# Patient Record
Sex: Female | Born: 1960 | Race: Black or African American | Hispanic: No | Marital: Married | Smoking: Never smoker
Health system: Southern US, Community
[De-identification: ages and names within clinical notes are randomized; demographics above are authoritative.]

## PROBLEM LIST (undated history)

## (undated) DIAGNOSIS — Z803 Family history of malignant neoplasm of breast: Secondary | ICD-10-CM

## (undated) DIAGNOSIS — Z800 Family history of malignant neoplasm of digestive organs: Secondary | ICD-10-CM

## (undated) DIAGNOSIS — N8502 Endometrial intraepithelial neoplasia [EIN]: Secondary | ICD-10-CM

## (undated) DIAGNOSIS — Z1509 Genetic susceptibility to other malignant neoplasm: Secondary | ICD-10-CM

## (undated) DIAGNOSIS — IMO0002 Reserved for concepts with insufficient information to code with codable children: Secondary | ICD-10-CM

## (undated) DIAGNOSIS — Z1379 Encounter for other screening for genetic and chromosomal anomalies: Secondary | ICD-10-CM

## (undated) HISTORY — PX: BUNIONECTOMY: SHX129

## (undated) HISTORY — DX: Encounter for other screening for genetic and chromosomal anomalies: Z13.79

## (undated) HISTORY — DX: Genetic susceptibility to other malignant neoplasm: Z15.09

## (undated) HISTORY — DX: Reserved for concepts with insufficient information to code with codable children: IMO0002

## (undated) HISTORY — DX: Family history of malignant neoplasm of digestive organs: Z80.0

## (undated) HISTORY — DX: Endometrial intraepithelial neoplasia (EIN): N85.02

## (undated) HISTORY — DX: Family history of malignant neoplasm of breast: Z80.3

---

## 2010-10-11 HISTORY — PX: HYSTEROSCOPY WITH D & C: SHX1775

## 2010-12-04 ENCOUNTER — Ambulatory Visit: Payer: 59 | Attending: Gynecology | Admitting: Gynecology

## 2010-12-04 DIAGNOSIS — N8502 Endometrial intraepithelial neoplasia [EIN]: Secondary | ICD-10-CM | POA: Insufficient documentation

## 2010-12-04 DIAGNOSIS — I10 Essential (primary) hypertension: Secondary | ICD-10-CM | POA: Insufficient documentation

## 2010-12-04 DIAGNOSIS — Z79899 Other long term (current) drug therapy: Secondary | ICD-10-CM | POA: Insufficient documentation

## 2010-12-08 NOTE — Consult Note (Signed)
Bailey Gomez, Bailey Gomez NO.:  [id number]  MEDICAL RECORD NO.:  [id number]           PATIENT TYPE:  O  LOCATION:  GYN                          FACILITY:  [facility]  PHYSICIAN:  De Blanch, M.D.DATE OF BIRTH:  19-Dec-1960  DATE OF CONSULTATION:  12/04/2010 DATE OF DISCHARGE:                                CONSULTATION   REFERRING PHYSICIAN:  Dr. Hassell Done  CHIEF COMPLAINT:  Endometrial hyperplasia with atypia.  HISTORY OF PRESENT ILLNESS:  A 50 year old [place] American female seen in consultation at the request of Dr. Hassell Done regarding new diagnosis of complex hyperplasia with atypia.  The patient presented with menometrorrhagia and underwent an endometrial biopsy followed by a hysteroscopy and D and C performed on November 04, 2010.  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  The patient is having minimal amount of spotting at the present time.  The patient denies any hormone use or any other risk factors for hyperplasia with atypia.  PAST MEDICAL HISTORY/MEDICAL ILLNESSES:  Hypertension.  PAST SURGICAL HISTORY:  Bunionectomy and cesarean section.  CURRENT MEDICATIONS: 1. Multivitamins. 2. Fish oil. 3. Amlodipine 5 mg daily.  DRUG ALLERGIES:  None.  FAMILY HISTORY:  Negative for gynecologic, breast, or colon cancers.  SOCIAL HISTORY:  The patient is married.  She works full-time at [employer] in [place].  She does not smoke.  OBSTETRICAL HISTORY:  Gravida 2.  REVIEW OF SYSTEMS:  A 10-point comprehensive review of systems negative except as noted above.  PHYSICAL EXAMINATION:  VITAL SIGNS:  Weight 124 pounds, blood pressure 138/80, pulse 64. GENERAL:  Patient is a healthy female, in no acute distress. HEENT:  Negative. NECK:  Supple without thyromegaly.  There is no supraclavicular or inguinal adenopathy. ABDOMEN:  Soft, nontender.  No mass,  organomegaly, ascites, or hernias noted.  Pfannenstiel incision is well healed. PELVIC:  EGBUS, vagina, bladder, urethra are normal.  Cervix is parous with good support and minimal descent.  Uterus is anterior normal shape, size, consistency.  There are no adnexal masses noted.  Rectovaginal exam confirms. LOWER EXTREMITIES:  Without edema or varicosities.  IMPRESSION:  Complex endometrial hyperplasia with atypia.  I had a lengthy discussion with the patient and her husband regarding the natural history of this disease and therefore recommend that she undergo a hysterectomy for definitive treatment.  We had a lengthy discussion regarding the pros and cons of removing the ovaries versus preserving the ovaries and at this juncture she wished to preserve the ovaries if they appear normal.  The patient understands that we will obtain intraoperative frozen section and if a invasive malignancy is identified, we will remove the ovaries and potentially could perform lymphadenectomy.  Of the routes of surgery, we have decided to proceed with total abdominal hysterectomy through the previous Pfannenstiel incision.  The patient wished to go ahead with surgery on June 12th and she understands that will be performed by Dr. Laurette Schimke.  The risks of surgery including hemorrhage, infection, injury to adjacent viscera, thrombolic complications, anesthetic risks were all outlined and all questions were  answered.     De Blanch, M.D.  CC Hassell Done, MD    Telford Nab, RN     DC/MEDQ  D:  12/04/2010  T:  12/05/2010  Job:  [id number]  Electronically Signed by De Blanch M.D. on 12/08/2010 08:34:19 AM

## 2010-12-18 ENCOUNTER — Encounter (HOSPITAL_COMMUNITY): Payer: 59

## 2010-12-18 ENCOUNTER — Other Ambulatory Visit: Payer: Self-pay | Admitting: Gynecologic Oncology

## 2010-12-18 ENCOUNTER — Ambulatory Visit (HOSPITAL_COMMUNITY)
Admission: RE | Admit: 2010-12-18 | Discharge: 2010-12-18 | Disposition: A | Discharge status: Home / Self Care | Payer: 59 | Source: Ambulatory Visit | Attending: Obstetrics & Gynecology | Admitting: Obstetrics & Gynecology

## 2010-12-18 ENCOUNTER — Other Ambulatory Visit: Payer: Self-pay | Admitting: Obstetrics & Gynecology

## 2010-12-18 DIAGNOSIS — Z01810 Encounter for preprocedural cardiovascular examination: Secondary | ICD-10-CM | POA: Insufficient documentation

## 2010-12-18 DIAGNOSIS — I10 Essential (primary) hypertension: Secondary | ICD-10-CM | POA: Insufficient documentation

## 2010-12-18 DIAGNOSIS — Z01812 Encounter for preprocedural laboratory examination: Secondary | ICD-10-CM | POA: Insufficient documentation

## 2010-12-18 DIAGNOSIS — Z01818 Encounter for other preprocedural examination: Secondary | ICD-10-CM | POA: Insufficient documentation

## 2010-12-18 LAB — SURGICAL PCR SCREEN
MRSA, PCR: NEGATIVE
Staphylococcus aureus: NEGATIVE

## 2010-12-22 ENCOUNTER — Other Ambulatory Visit: Payer: Self-pay | Admitting: Gynecologic Oncology

## 2010-12-22 ENCOUNTER — Inpatient Hospital Stay (HOSPITAL_COMMUNITY)
Admission: RE | Admit: 2010-12-22 | Discharge: 2010-12-25 | DRG: 740 | Disposition: A | Discharge status: Home / Self Care | Payer: 59 | Source: Ambulatory Visit | Attending: Obstetrics & Gynecology | Admitting: Obstetrics & Gynecology

## 2010-12-22 DIAGNOSIS — C549 Malignant neoplasm of corpus uteri, unspecified: Principal | ICD-10-CM | POA: Diagnosis present

## 2010-12-22 DIAGNOSIS — R339 Retention of urine, unspecified: Secondary | ICD-10-CM | POA: Diagnosis not present

## 2010-12-22 DIAGNOSIS — I10 Essential (primary) hypertension: Secondary | ICD-10-CM | POA: Diagnosis present

## 2010-12-22 DIAGNOSIS — D259 Leiomyoma of uterus, unspecified: Secondary | ICD-10-CM | POA: Diagnosis present

## 2010-12-22 DIAGNOSIS — N390 Urinary tract infection, site not specified: Secondary | ICD-10-CM | POA: Diagnosis not present

## 2010-12-22 LAB — SAMPLE TO BLOOD BANK

## 2010-12-23 HISTORY — PX: ABDOMINAL HYSTERECTOMY: SHX81

## 2010-12-23 LAB — CBC
MCHC: 32.4 g/dL (ref 30.0–36.0)
Platelets: 237 10*3/uL (ref 150–400)
RDW: 12.8 % (ref 11.5–15.5)
WBC: 12.7 10*3/uL — ABNORMAL HIGH (ref 4.0–10.5)

## 2010-12-23 LAB — BASIC METABOLIC PANEL
Chloride: 100 mEq/L (ref 96–112)
GFR calc Af Amer: >60 mL/min (ref >60)
GFR calc non Af Amer: >60 mL/min (ref >60)
Potassium: 4.2 mEq/L (ref 3.5–5.1)
Sodium: 133 mEq/L — ABNORMAL LOW (ref 135–145)

## 2010-12-23 LAB — URINALYSIS, ROUTINE W REFLEX MICROSCOPIC
Glucose, UA: NEGATIVE mg/dL
Specific Gravity, Urine: 1.007 (ref 1.005–1.030)
Urobilinogen, UA: 0.2 mg/dL (ref 0.0–1.0)

## 2010-12-23 LAB — URINE MICROSCOPIC-ADD ON

## 2010-12-23 NOTE — Op Note (Addendum)
  NAMETAELA, Bailey Gomez NO.:  [id number]  MEDICAL RECORD NO.:  [id number]  LOCATION:                               FACILITY:  [facility]  PHYSICIAN:  Laurette Schimke, MD     DATE OF BIRTH:  1960-10-24  DATE OF PROCEDURE: DATE OF DISCHARGE:  12/18/2010                              OPERATIVE REPORT   PREOPERATIVE DIAGNOSIS:  Complex atypical hyperplasia.  POSTOPERATIVE DIAGNOSIS:  Complex atypical hyperplasia.  PROCEDURE:  Total abdominal hysterectomy.  SURGEON:  Laurette Schimke, MD  ASSISTANT:  Roseanna Rainbow, MD and Telford Nab, RN  ANESTHESIA:  General endotracheal.  FINDINGS:  Enlarged uterus.  SPECIMENS:  Uterus and cervix.  DESCRIPTION OF PROCEDURE:  The patient was taken to operating room, placed under general endotracheal anesthesia without any difficulty. She was prepped and draped in the usual sterile fashion.  A Pfannenstiel skin incision was made and peritoneal cavity entered.  The upper abdomen was explored and was unremarkable.  Ovaries were notable for a right simple paraovarian cyst and the left paratubal cyst.  A Bookwalter retractor was inserted.  The right round ligament was transected. Retroperitoneal space entered.  The ureter was identified and the broad ligament entered.  The  utero-ovarian ligament was clamped and transected.  The bladder flap was dissected off the lower uterine segment.  In a similar manner, the left round ligament was transected and retroperitoneal space entered.  The ureter was identified, the broad ligament entered and the utero-ovarian ligament clamped, transected, and ligated.  The uterine vessels were skeletonized and clamped, transected, and ligated.  The cervical and paracervical tissues were clamped, transected, and sutured to just below the cervix.  Clamps were placed just inferior to the cervix and the specimen removed and sent to pathology for evaluation.  The vagina was closed with an 0 PDS  suture in the standard [facility] fashion.  The utero-ovarian ligament was sutured to the round ligament to keep the ovary and tube off the vaginal cuff.  The abdomen and pelvis were irrigated and instruments removed.  Frozen section returned without any evidence of malignancy.  The laps were removed from the pelvis.  The rectus muscles were approximated to each other with interrupted 0 Vicryl sutures.  The fascia was closed with 0 PDS suture. Subcutaneous tissues were irrigated and drained.  The skin closed with a running subcuticular suture.  Sponges and needle count correct x3. Foley draining clear urine.  Estimated blood loss 50 cc.  DISPOSITION:  The patient was taken to recovery room in stable condition.     Laurette Schimke, MD     WB/MEDQ  D:  12/22/2010  T:  12/23/2010  Job:  [id number]  cc:   Roseanna Rainbow, M.D. Fax: [phone]  Telford Nab, R.N. [address] [place], [place] [address]  Hassell Done, MD Fax: [phone]  Electronically Signed by Laurette Schimke MD on 01/08/2011 07:50:54 AM

## 2010-12-25 LAB — URINE CULTURE: Colony Count: >=100000

## 2010-12-25 NOTE — Discharge Summary (Signed)
  NAMEKRISTE, BROMAN NO.:  [id number]  MEDICAL RECORD NO.:  [id number]  LOCATION:  1527                         FACILITY:  [facility]  PHYSICIAN:  Roseanna Rainbow, M.D.DATE OF BIRTH:  November 09, 1960  DATE OF ADMISSION:  12/22/2010 DATE OF DISCHARGE:  12/25/2010                              DISCHARGE SUMMARY   CHIEF COMPLAINT:  The patient is a 50 year old with a diagnosis of complex endometrial hyperplasia with atypia who presents for operative management.  Please see the dictated history and physical per Dr. Emmaline Kluver for details.  HOSPITAL COURSE:  The patient was admitted and underwent a total abdominal hysterectomy.  Please see the dictated operative summary for findings.  On postoperative day #1, her hemoglobin was 12.2.  The patient had some nausea and vomiting that resolved.  On postoperative day #2, there was an episode of urinary retention.  The Foley catheter was replaced for 24 hours.  Voiding trial was repeated and the patient was able to void without difficulty and residual was under 150 mL.  A urine culture and sensitivity grew out enterococcus greater than 100,000 colonies per mL.  She was discharged to home on postoperative day #3 tolerating a regular diet.  DISCHARGE DIAGNOSES:  endometrioid adenocarcinoma, leiomyomata stage 5 or stage IA, urinary tract infection.  PROCEDURES:  Total abdominal hysterectomy, bilateral salpingo- oophorectomy.  CONDITION:  Good.  DIET:  Regular.  ACTIVITY:  Pelvic rest, progressive activity.  MEDICATIONS:  Amlodipine, multivitamin, fish oil, Percocet 5/325 one to two tabs every 6 hours as needed, ampicillin 500 mg tab 1 tab p.o. q.i.d. #28.  ACTIVITY:  Pelvic rest, progressive activity.  DISPOSITION:  The patient will follow up in the GYN oncology office.     Roseanna Rainbow, M.D.     LAJ/MEDQ  D:  12/25/2010  T:  12/25/2010  Job:  [id number]  cc:   Telford Nab, R.N. [address] [place], [place] [address]  Hassell Done, MD Fax: [phone]  Electronically Signed by Antionette Char M.D. on 12/25/2010 05:00:40 PM

## 2011-01-27 ENCOUNTER — Ambulatory Visit: Payer: 59 | Attending: Gynecologic Oncology | Admitting: Gynecologic Oncology

## 2011-01-27 DIAGNOSIS — N83209 Unspecified ovarian cyst, unspecified side: Secondary | ICD-10-CM | POA: Insufficient documentation

## 2011-01-27 DIAGNOSIS — C55 Malignant neoplasm of uterus, part unspecified: Secondary | ICD-10-CM | POA: Insufficient documentation

## 2011-01-29 NOTE — Consult Note (Signed)
  NAMEJESSABELLE, Gomez NO.:  [id number]  MEDICAL RECORD NO.:  [id number]  LOCATION:  GYN                          FACILITY:  [facility]  PHYSICIAN:  Anay Walter A. Duard Brady, MD    DATE OF BIRTH:  10-09-1960  DATE OF CONSULTATION:  01/27/2011 DATE OF DISCHARGE:                                CONSULTATION   HISTORY:  Ms. Chestine Spore is a very pleasant 50 year old who was seen by Dr. Leota Jacobsen with a biopsy of complex hyperplasia with atypia.  She presented to him with menometrorrhagia and underwent endometrial biopsy followed by hysteroscopy, D and C in April.  She did well from that procedure.  She was seen by [place] and surgery was recommended.  She subsequent underwent a TAH by Dr. Nelly Rout on December 23, 2010.  The patient wished to keep her ovaries at the time of surgery if they were unremarkable.  Operative findings included a simple paraovarian cyst on the right and a left paratubal cyst that were similarly removed. Surgery was uncomplicated.  Pathology revealed multiple microscopic foci of well-differentiated endometrioid adenocarcinoma arising in a large endometrial polyp associated with complex hyperplasia, confined within the endometrium.  There was no lymphovascular space involvement.  The cervix was negative.  There were fibroids.  She had a right paraovarian cyst that was benign and a left tubal cyst that was negative.  She comes in today for her postoperative check.  She is overall doing quite well. She feels that she has really recovered quite well.  She is looking forward to going back to work next week.  She did have problems with initial constipation postoperatively and that subsequently resolved.  PHYSICAL EXAMINATION:  VITAL SIGNS:  Weight 124 pounds, blood pressure 120/80, pulse 64, respirations 16, temperature 98. GENERAL:  A well-nourished, well-developed female, in no acute distress. ABDOMEN:  A well-healed Pfannenstiel skin incision.  There is one small area of  granulation tissue measuring approximately 4 mm.  Silver nitrate was applied. ABDOMEN:  Soft and nontender. PELVIC:  External genitalia is within normal limits.  The vaginal cuff is visualized.  It is healing well.  Bimanual examination reveals suture line to be intact.  There is no nodularity.  There is no fluctuance or tenderness.  ASSESSMENT:  A 50 year old with a stage IA, grade 1 endometrioid adenocarcinoma, doing well postoperatively.  PLAN: 1. She will follow up with [place] in 6 months and will begin alternating     visits with Dr. Leota Jacobsen every 6 months for     1 year's time. 2. She would like to go back to work next week and she has paperwork     for [place] to complete. 3. I have asked her refrain from intercourse for 2 additional weeks.     Jazmin Vensel A. Duard Brady, MD     PAG/MEDQ  D:  01/27/2011  T:  01/27/2011  Job:  [id number]  cc:   Telford Nab, R.N. [address] [place], [place] [address]  Hassell Done, MD Fax: [phone]  Electronically Signed by Cleda Mccreedy MD on 01/29/2011 10:27:04 AM

## 2012-02-15 ENCOUNTER — Encounter: Payer: Self-pay | Admitting: Gynecologic Oncology

## 2012-02-17 ENCOUNTER — Encounter: Payer: Self-pay | Admitting: Gynecologic Oncology

## 2012-02-17 ENCOUNTER — Ambulatory Visit: Payer: 59 | Attending: Gynecologic Oncology | Admitting: Gynecologic Oncology

## 2012-02-17 VITALS — BP 110/80 | HR 64 | Temp 97.6°F | Resp 16 | Ht 62.64 in | Wt 129.4 lb

## 2012-02-17 DIAGNOSIS — C541 Malignant neoplasm of endometrium: Secondary | ICD-10-CM

## 2012-02-17 DIAGNOSIS — Z90710 Acquired absence of both cervix and uterus: Secondary | ICD-10-CM | POA: Insufficient documentation

## 2012-02-17 DIAGNOSIS — C549 Malignant neoplasm of corpus uteri, unspecified: Secondary | ICD-10-CM | POA: Insufficient documentation

## 2012-02-17 MED ORDER — MEGESTROL ACETATE 40 MG PO TABS: 40.0000 mg | ORAL_TABLET | Freq: Every day | ORAL | Status: DC

## 2012-02-17 NOTE — Progress Notes (Signed)
HISTORY: Bailey Gomez is a very pleasant 51 y.o. year-old who was seen by Dr.  Leota Jacobsen with a biopsy of complex hyperplasia with atypia. She  presented to him with menometrorrhagia and underwent endometrial biopsy followed by hysteroscopy, D and C in April. She did well from that  procedure. She was seen by [place] and surgery was recommended. She subsequent underwent a TAH by Dr. Nelly Rout on December 23, 2010. The  patient wished to keep her ovaries at the time of surgery if they were unremarkable. Operative findings included a simple paraovarian cyst on  the right and a left paratubal cyst that were similarly removed. Surgery was uncomplicated. Pathology revealed multiple microscopic foci  of well-differentiated endometrioid adenocarcinoma arising in a large endometrial polyp associated with complex hyperplasia, confined within  the endometrium. There was no lymphovascular space involvement. The cervix was negative. There were fibroids. She had a right paraovarian  cyst that was benign and a left tubal cyst that was negative.   INTERVAL HISTORY:  Patient has done well since surgery.  Saw Dr. Leota Jacobsen 6 months ago.  Last mammogram 12/2011.  Last colonoscopy at 51 years old.  Reports significant vasomotor instability, awakens several times from sleep.  C/o vaginal dryness.    ROS:  Feels good.  Reports hot flashes that awaken her from sleep 5-6 times.  No weight loss, no nausea/vomiting, no hematochezia, hematuria or vaginal bleeding.  No adenopathy.  Reports vaginal dryness.  Reports constipation.  Otherwise system review is negative.  PHYSICAL EXAMINATION: VITAL SIGNS: BP 110/80  Pulse 64  Temp 97.6 F (36.4 C) (Oral)  Resp 16  Ht 5' 2.64" (1.591 m)  Wt 129 lb 6.4 oz (58.695 kg)  BMI 23.19 kg/m2 GENERAL: A well-nourished, well-developed female, in no acute distress.  ABDOMEN: A well-healed Pfannenstiel skin incision. Incision is intact without hernia. BACK:  No CVAT LN:  No cervical supraclavicular or  inguinal adenopathy PELVIC: External genitalia is within normal limits. The vagina is atrophic, no masses in the cul de sac Rectal:  Good tone, no masses EXT:  No CCE  ASSESSMENT: A 51 y.o.-year-old with a stage IA, grade 1 endometrioid adenocarcinoma, doing well who is without evidence of disease PLAN:  1.F/U with Dr. Leota Jacobsen in 6 months  2.  F/U with Gyn Onc in 12 months 3.  Megace 40mg  for vasomotor instability.  Patient counseled on weight gain and fluid retention.

## 2012-02-17 NOTE — Patient Instructions (Addendum)
No evidence of disease PLAN:  1.F/U with Dr. Leota Jacobsen in 6 months  2.  F/U with Gyn Onc in 12 months 3.  Megace 40mg  for vasomotor instability.  Patient counseled on weight gain and fluid retention.  Thank you very much [name] for allowing me to provide care for you today.  I appreciate your confidence in choosing our Gynecologic Oncology team.  If you have any questions about your visit today please call our office and we will get back to you as soon as possible.  [name]. Cathlin Buchan MD., PhD Gynecologic Oncology

## 2012-10-30 ENCOUNTER — Other Ambulatory Visit: Payer: Self-pay | Admitting: Gynecologic Oncology

## 2013-03-06 ENCOUNTER — Ambulatory Visit: Payer: 59 | Admitting: Gynecologic Oncology

## 2013-04-12 ENCOUNTER — Encounter: Payer: Self-pay | Admitting: Gynecologic Oncology

## 2013-04-12 ENCOUNTER — Ambulatory Visit: Payer: 59 | Attending: Gynecologic Oncology | Admitting: Gynecologic Oncology

## 2013-04-12 VITALS — BP 125/83 | HR 78 | Temp 98.9°F | Resp 16

## 2013-04-12 DIAGNOSIS — N952 Postmenopausal atrophic vaginitis: Secondary | ICD-10-CM | POA: Insufficient documentation

## 2013-04-12 DIAGNOSIS — N951 Menopausal and female climacteric states: Secondary | ICD-10-CM

## 2013-04-12 DIAGNOSIS — C541 Malignant neoplasm of endometrium: Secondary | ICD-10-CM

## 2013-04-12 DIAGNOSIS — R55 Syncope and collapse: Secondary | ICD-10-CM | POA: Insufficient documentation

## 2013-04-12 DIAGNOSIS — N83209 Unspecified ovarian cyst, unspecified side: Secondary | ICD-10-CM | POA: Insufficient documentation

## 2013-04-12 DIAGNOSIS — D259 Leiomyoma of uterus, unspecified: Secondary | ICD-10-CM | POA: Insufficient documentation

## 2013-04-12 DIAGNOSIS — N838 Other noninflammatory disorders of ovary, fallopian tube and broad ligament: Secondary | ICD-10-CM | POA: Insufficient documentation

## 2013-04-12 DIAGNOSIS — N840 Polyp of corpus uteri: Secondary | ICD-10-CM | POA: Insufficient documentation

## 2013-04-12 DIAGNOSIS — C549 Malignant neoplasm of corpus uteri, unspecified: Secondary | ICD-10-CM | POA: Insufficient documentation

## 2013-04-12 MED ORDER — MEGESTROL ACETATE 40 MG PO TABS: ORAL_TABLET | ORAL | Status: DC

## 2013-04-12 NOTE — Patient Instructions (Addendum)
F/u in 1 year    Thank you very much Bailey Gomez for allowing me to provide care for you today.  I appreciate your confidence in choosing our Gynecologic Oncology team.  If you have any questions about your visit today please call our office and we will get back to you as soon as possible.  [name]. Nihar Klus MD., PhD Gynecologic Oncology

## 2013-04-12 NOTE — Progress Notes (Signed)
Office Visit:  GYN ONCOLOGY  CHIEF COMPLAINT:  Endometrial cancer surveillance  HISTORY: [name] is a very pleasant 52 y.o. year-old who was seen by Dr.  Leota Jacobsen with a biopsy of complex hyperplasia with atypia. She presented to him with menometrorrhagia and underwent endometrial biopsy followed by hysteroscopy, D and C in April. She did well from that procedure. She was seen by [place] and surgery was recommended. She subsequent underwent a TAH . The patient wished to keep her ovaries at the time of surgery if they were unremarkable. Operative findings included a simple paraovarian cyst on the right and a left paratubal cyst that were similarly removed. Surgery was uncomplicated. Pathology revealed multiple microscopic foci of well-differentiated endometrioid adenocarcinoma arising in a large endometrial polyp associated with complex hyperplasia, confined within the endometrium. There was no lymphovascular space involvement. The cervix was negative. There were fibroids. She had a right paraovarian cyst that was benign and a left tubal cyst that was negative.    Patient has done well since surgery.  Saw Dr. Leota Jacobsen 6 months ago.  Last mammogram 12/2011.  Last colonoscopy at 52 years old.  Reports significant vasomotor instability, awakens several times from sleep.  C/o vaginal dryness.    ROS:  Feels good. No cough, SOB, no chest pain,  Reports hot flashes that awaken her from sleep once nighlty on Megace   No weight loss, no nausea/vomiting, no hematochezia, hematuria or vaginal bleeding.  Reports lower pelvic discomfort with sexual intercourse.  No adenopathy.  Reports vaginal dryness.  Reports thinning of her eyebrows and eyelashes. Reports constipation.  Otherwise 10 system review is negative.  PHYSICAL EXAMINATION: VITAL SIGNS:  BP 125/83  Pulse 78  Temp(Src) 98.9 F (37.2 C) (Oral)  Resp 16 Wt Readings from Last 3 Encounters:  02/17/12 129 lb 6.4 oz (58.695 kg)  GENERAL: A well-nourished,  well-developed female, in no acute distress.  ABDOMEN: A well-healed Pfannenstiel skin incision. Incision is intact without hernia. BACK:  No CVAT LN:  No cervical supraclavicular or inguinal adenopathy PELVIC: External genitalia is within normal limits. The vagina is atrophic, no masses in the cul de sac Rectal:  Good tone, no masses EXT:  No CCE  ASSESSMENT: A 52 y.o.-year-old with a stage IA, grade 1 endometrioid adenocarcinoma, doing well who is without evidence of disease  PLAN:  1.F/U with Dr. Leota Jacobsen in 6 months  2.  F/U with Gyn Onc in 12 months 3.  Megace 40mg   QOD for vasomotor instability.  Patient counseled on weight gain and fluid retention.  F/U with dermatology regarding eyelashes and eyebrows

## 2015-12-03 ENCOUNTER — Ambulatory Visit (INDEPENDENT_AMBULATORY_CARE_PROVIDER_SITE_OTHER): Payer: Commercial Managed Care - HMO

## 2015-12-03 ENCOUNTER — Ambulatory Visit (INDEPENDENT_AMBULATORY_CARE_PROVIDER_SITE_OTHER): Payer: Commercial Managed Care - HMO | Admitting: Sports Medicine

## 2015-12-03 ENCOUNTER — Encounter: Payer: Self-pay | Admitting: Sports Medicine

## 2015-12-03 DIAGNOSIS — M898X9 Other specified disorders of bone, unspecified site: Secondary | ICD-10-CM | POA: Diagnosis not present

## 2015-12-03 DIAGNOSIS — M778 Other enthesopathies, not elsewhere classified: Secondary | ICD-10-CM

## 2015-12-03 DIAGNOSIS — M7750 Other enthesopathy of unspecified foot: Secondary | ICD-10-CM | POA: Diagnosis not present

## 2015-12-03 DIAGNOSIS — M79673 Pain in unspecified foot: Secondary | ICD-10-CM

## 2015-12-03 DIAGNOSIS — M19079 Primary osteoarthritis, unspecified ankle and foot: Secondary | ICD-10-CM

## 2015-12-03 DIAGNOSIS — M779 Enthesopathy, unspecified: Secondary | ICD-10-CM

## 2015-12-03 MED ORDER — TRIAMCINOLONE ACETONIDE 10 MG/ML IJ SUSP: 10.0000 mg | Freq: Once | INTRAMUSCULAR | Status: AC

## 2015-12-03 MED ORDER — DICLOFENAC SODIUM 75 MG PO TBEC: 75.0000 mg | DELAYED_RELEASE_TABLET | Freq: Two times a day (BID) | ORAL | Status: DC

## 2015-12-03 NOTE — Progress Notes (Signed)
Patient ID: Bailey Gomez, female   DOB: Nov 20, 1960, 55 y.o.   MRN: [id number] Subjective: Bailey Gomez is a 55 y.o. female patient who presents to office for evaluation of right>left foot pain. Patient complains of progressive pain especially over the last few weeks since returning to work over the top of her R>L foot. Reports that she works 12 hours and does a lot of walking and has developed pain at the top with bumps. Patient has tried changes in shoes and compression stockings with a little relief in symptoms. Patient denies any other pedal complaints. Denies injury/trip/fall/sprain/any causative factors.   Patient Active Problem List   Diagnosis Date Noted  . Menopausal symptoms 04/12/2013  . Endometrial cancer ([place]) 02/17/2012    Current Outpatient Prescriptions on File Prior to Visit  Medication Sig Dispense Refill  . megestrol (MEGACE) 40 MG tablet TAKE 1 TABLET BY MOUTH EVERY DAY 30 tablet 12  . simvastatin (ZOCOR) 5 MG tablet Take 5 mg by mouth at bedtime.     No current facility-administered medications on file prior to visit.    No Known Allergies  Objective:  General: Alert and oriented x3 in no acute distress  Dermatology:Old surgical scars well healed, No open lesions bilateral lower extremities, no webspace macerations, no ecchymosis bilateral, all nails x 10 are well manicured.  Vascular: Dorsalis Pedis and Posterior Tibial pedal pulses palpable, Capillary Fill Time 3 seconds,(+) pedal hair growth bilateral, no edema bilateral lower extremities, Temperature gradient within normal limits.  Neurology: Johney Maine sensation intact via light touch bilateral. (- )Tinels sign bilateral.   Musculoskeletal: Mild tenderness with palpation at bony prominence at dorsal right>left midfoot,No pain with calf compression bilateral. Midtarsal and ankle joint range of motion limited bilateral, Subtalar joint range of motion is within normal limits, there is no 1st ray hypermobility noted  bilateral, mild decreased 1st MPJ rom Right>Left with functional limitus noted on weightbearing exam. Strength within normal limits in all groups bilateral.   Xrays  Right and Left Foot   Impression: Hardware intact at previous bunion and bunionette surgical sites, calcaneal spur and midfoot and ankle arthritis with spurs present. Soft tissues within normal limits.   Assessment and Plan: Problem List Items Addressed This Visit    None    Visit Diagnoses    Foot pain, unspecified laterality    -  Primary    Relevant Medications    triamcinolone acetonide (KENALOG) 10 MG/ML injection 10 mg    diclofenac (VOLTAREN) 75 MG EC tablet    Other Relevant Orders    DG Foot 2 Views Left    DG Foot 2 Views Right    Capsulitis of foot, unspecified laterality        Relevant Medications    triamcinolone acetonide (KENALOG) 10 MG/ML injection 10 mg    diclofenac (VOLTAREN) 75 MG EC tablet    Bony exostosis        Relevant Medications    triamcinolone acetonide (KENALOG) 10 MG/ML injection 10 mg    diclofenac (VOLTAREN) 75 MG EC tablet    Osteoarthritis of ankle and foot, unspecified laterality        Relevant Medications    triamcinolone acetonide (KENALOG) 10 MG/ML injection 10 mg    diclofenac (VOLTAREN) 75 MG EC tablet        -Complete examination performed -Xrays reviewed -Discussed treatement options for capsulitis with exostosis secondary to arthritis  -Rx Diclofenac to take as instructed until completed -After oral consent and aseptic prep, injected a  mixture containing 1 ml of 2%  plain lidocaine, 1 ml 0.5% plain marcaine, 0.5 ml of kenalog 10 and 0.5 ml of dexamethasone phosphate into right and left dorsal midfoot at site of exostosis without complication. Post-injection care discussed with patient.  -Applied offloading pad to site to prevent dorsal irritation, advised patient to do the same with she is wearing enclosed shoes -Recommend ice and elevation as needed -Recommend  compression stocking for periods of long standing and walking -Recommend good supportive shoes and alternative lacing -Patient to return to office as needed or sooner if condition worsens.  [name], DPM

## 2016-04-29 HISTORY — PX: TOTAL HIP ARTHROPLASTY: SHX124

## 2016-06-10 ENCOUNTER — Ambulatory Visit (INDEPENDENT_AMBULATORY_CARE_PROVIDER_SITE_OTHER): Payer: Commercial Managed Care - HMO | Admitting: Sports Medicine

## 2016-06-10 ENCOUNTER — Encounter: Payer: Self-pay | Admitting: Sports Medicine

## 2016-06-10 DIAGNOSIS — M7750 Other enthesopathy of unspecified foot: Secondary | ICD-10-CM

## 2016-06-10 DIAGNOSIS — M779 Enthesopathy, unspecified: Principal | ICD-10-CM

## 2016-06-10 DIAGNOSIS — M778 Other enthesopathies, not elsewhere classified: Secondary | ICD-10-CM

## 2016-06-10 DIAGNOSIS — M19079 Primary osteoarthritis, unspecified ankle and foot: Secondary | ICD-10-CM

## 2016-06-10 DIAGNOSIS — M898X9 Other specified disorders of bone, unspecified site: Secondary | ICD-10-CM

## 2016-06-10 MED ORDER — TRIAMCINOLONE ACETONIDE 40 MG/ML IJ SUSP: 20.0000 mg | Freq: Once | INTRAMUSCULAR | Status: AC

## 2016-06-10 NOTE — Progress Notes (Signed)
Patient ID: Bailey Gomez, female   DOB: 19-May-1961, 55 y.o.   MRN: [id number]   Subjective: Bailey Gomez is a 55 y.o. female patient who returns to office for evaluation of right>left foot pain. Patient states that since last week started to have increased pain over the bony prominence of her right foot. States that the injection that she received last visit helped tremendously and she desires another states that since her left total hip replacement surgery. She has been overcompensating and over favoring the right foot. 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 Patient denies any other acute changes or problems.   Patient Active Problem List   Diagnosis Date Noted  . Menopausal symptoms 04/12/2013  . Endometrial cancer ([place]) 02/17/2012    Current Outpatient Prescriptions on File Prior to Visit  Medication Sig Dispense Refill  . amLODipine (NORVASC) 10 MG tablet Take 10 mg by mouth daily.  3  . Dermatological Products, Misc. (NUVAIL) SOLN APPLY AT BEDTIME TO NAIL  1  . diclofenac (VOLTAREN) 75 MG EC tablet Take 1 tablet (75 mg total) by mouth 2 (two) times daily. 30 tablet 0  . megestrol (MEGACE) 40 MG tablet TAKE 1 TABLET BY MOUTH EVERY DAY 30 tablet 12  . simvastatin (ZOCOR) 5 MG tablet Take 5 mg by mouth at bedtime.     Current Facility-Administered Medications on File Prior to Visit  Medication Dose Route Frequency Provider Last Rate Last Dose  . triamcinolone acetonide (KENALOG) 10 MG/ML injection 10 mg  10 mg Other Once [employer], DPM        No Known Allergies  Objective:  General: Alert and oriented x3 in no acute distress  Dermatology:Old surgical scars well healed, No open lesions bilateral lower extremities, no webspace macerations, no ecchymosis bilateral, all nails x 10 are well manicured.  Vascular: Dorsalis Pedis and Posterior Tibial pedal pulses palpable, Capillary Fill Time 3 seconds, (+) pedal hair growth  bilateral, no edema bilateral lower extremities, Temperature gradient within normal limits.  Neurology: Gross sensation intact via light touch on right. 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 (- )Tinels sign bilateral.   Musculoskeletal: Mild tenderness with palpation at bony prominence at dorsal right>left midfoot,No pain with calf compression bilateral. Midtarsal and ankle joint range of motion limited bilateral, Subtalar joint range of motion is within normal limits, there is no 1st ray hypermobility noted bilateral, mild decreased 1st MPJ rom Right>Left with functional limitus noted on weightbearing exam. Strength within normal limits in all groups bilateral.   Assessment and Plan: Problem List Items Addressed This Visit    None    Visit Diagnoses    Capsulitis of foot, unspecified laterality    -  Primary   R>L   Relevant Medications   triamcinolone acetonide (KENALOG-40) injection 20 mg (Start on 06/10/2016  5:30 PM)   Bony exostosis       Relevant Medications   triamcinolone acetonide (KENALOG-40) injection 20 mg (Start on 06/10/2016  5:30 PM)   Osteoarthritis of ankle and foot, unspecified laterality       Relevant Medications   aspirin 81 MG EC tablet   celecoxib (CELEBREX) 200 MG capsule   oxyCODONE (OXY IR/ROXICODONE) 5 MG immediate release tablet   triamcinolone acetonide (KENALOG-40) injection 20 mg (Start on 06/10/2016  5:30 PM)     -Complete examination performed -Previous Xrays reviewed -Discussed treatement options for capsulitis with exostosis secondary to arthritis  -  After oral consent and aseptic prep, injected a mixture containing 1 ml of 2% plain lidocaine, 1 ml 0.5% plain marcaine, 0.5 ml of kenalog 40 and 0.5 ml of dexamethasone phosphate into right dorsal midfoot at site of exostosis without complication. Post-injection care discussed with patient.  -Recommend Epsom soaks, ice, and elevation as needed -Recommend continue with  compression stocking for periods of long standing and walking -Recommend continue with good supportive shoes and alternative lacing. Advised to avoid shoes that will cause irritation to dorsal aspect of foot.  -Patient to return to office as needed or sooner if condition worsens.  [name], DPM

## 2016-08-09 DIAGNOSIS — R208 Other disturbances of skin sensation: Secondary | ICD-10-CM | POA: Diagnosis not present

## 2016-08-12 DIAGNOSIS — R208 Other disturbances of skin sensation: Secondary | ICD-10-CM | POA: Diagnosis not present

## 2016-08-13 DIAGNOSIS — G5702 Lesion of sciatic nerve, left lower limb: Secondary | ICD-10-CM | POA: Diagnosis not present

## 2016-08-17 DIAGNOSIS — G5702 Lesion of sciatic nerve, left lower limb: Secondary | ICD-10-CM | POA: Diagnosis not present

## 2016-08-19 DIAGNOSIS — G5702 Lesion of sciatic nerve, left lower limb: Secondary | ICD-10-CM | POA: Diagnosis not present

## 2016-08-19 DIAGNOSIS — R200 Anesthesia of skin: Secondary | ICD-10-CM | POA: Diagnosis not present

## 2016-08-31 DIAGNOSIS — M1612 Unilateral primary osteoarthritis, left hip: Secondary | ICD-10-CM | POA: Diagnosis not present

## 2016-08-31 DIAGNOSIS — Z96642 Presence of left artificial hip joint: Secondary | ICD-10-CM | POA: Diagnosis not present

## 2016-09-02 ENCOUNTER — Ambulatory Visit (INDEPENDENT_AMBULATORY_CARE_PROVIDER_SITE_OTHER): Payer: Commercial Managed Care - HMO | Admitting: Sports Medicine

## 2016-09-02 ENCOUNTER — Telehealth: Payer: Self-pay | Admitting: *Deleted

## 2016-09-02 ENCOUNTER — Ambulatory Visit (INDEPENDENT_AMBULATORY_CARE_PROVIDER_SITE_OTHER): Payer: Commercial Managed Care - HMO

## 2016-09-02 ENCOUNTER — Encounter: Payer: Self-pay | Admitting: Sports Medicine

## 2016-09-02 DIAGNOSIS — M779 Enthesopathy, unspecified: Secondary | ICD-10-CM

## 2016-09-02 DIAGNOSIS — M778 Other enthesopathies, not elsewhere classified: Secondary | ICD-10-CM

## 2016-09-02 DIAGNOSIS — R29898 Other symptoms and signs involving the musculoskeletal system: Secondary | ICD-10-CM

## 2016-09-02 DIAGNOSIS — M76821 Posterior tibial tendinitis, right leg: Secondary | ICD-10-CM | POA: Diagnosis not present

## 2016-09-02 DIAGNOSIS — M7750 Other enthesopathy of unspecified foot: Secondary | ICD-10-CM

## 2016-09-02 DIAGNOSIS — M79672 Pain in left foot: Secondary | ICD-10-CM | POA: Diagnosis not present

## 2016-09-02 DIAGNOSIS — M792 Neuralgia and neuritis, unspecified: Secondary | ICD-10-CM

## 2016-09-02 DIAGNOSIS — M898X9 Other specified disorders of bone, unspecified site: Secondary | ICD-10-CM

## 2016-09-02 DIAGNOSIS — M79671 Pain in right foot: Secondary | ICD-10-CM

## 2016-09-02 DIAGNOSIS — R531 Weakness: Secondary | ICD-10-CM

## 2016-09-02 DIAGNOSIS — M19079 Primary osteoarthritis, unspecified ankle and foot: Secondary | ICD-10-CM

## 2016-09-02 MED ORDER — TRIAMCINOLONE ACETONIDE 40 MG/ML IJ SUSP: 20.0000 mg | Freq: Once | INTRAMUSCULAR | Status: AC

## 2016-09-02 NOTE — Progress Notes (Signed)
Patient ID: Bailey Gomez, female   DOB: 09-24-1960, 56 y.o.   MRN: [id number]   Subjective: Bailey Gomez is a 56 y.o. female patient who returns to office for evaluation of right>left foot pain. Patient states that she is having pain on inside of right foot that hurts worse than the pain at top of her feet where there is arthritis. Patient requests injection on right and states that the weakness, pain, and change to left foot since after her hip surgery with nerve damage is getting worse. Reports that she got inserts from [facility] with pressure mapping and also got NCV/EMG studies done that showed damage with muscle weakness. Patient denies any other acute changes or problems.   Patient Active Problem List   Diagnosis Date Noted  . Menopausal symptoms 04/12/2013  . Endometrial cancer ([place]) 02/17/2012    Current Outpatient Prescriptions on File Prior to Visit  Medication Sig Dispense Refill  . amLODipine (NORVASC) 10 MG tablet Take 10 mg by mouth daily.  3  . aspirin 81 MG EC tablet     . celecoxib (CELEBREX) 200 MG capsule     . Dermatological Products, Misc. (NUVAIL) SOLN APPLY AT BEDTIME TO NAIL  1  . diclofenac (VOLTAREN) 75 MG EC tablet Take 1 tablet (75 mg total) by mouth 2 (two) times daily. 30 tablet 0  . docusate sodium (COLACE) 100 MG capsule     . HIBICLENS 4 % external liquid APPLY LIQUID TO AFFECTED AREA DAILY, STARTING SEVEN DAYS BEFORE SURGERY  0  . LYRICA 75 MG capsule     . megestrol (MEGACE) 40 MG tablet TAKE 1 TABLET BY MOUTH EVERY DAY 30 tablet 12  . oxyCODONE (OXY IR/ROXICODONE) 5 MG immediate release tablet     . pantoprazole (PROTONIX) 40 MG tablet     . simvastatin (ZOCOR) 5 MG tablet Take 5 mg by mouth at bedtime.     Current Facility-Administered Medications on File Prior to Visit  Medication Dose Route Frequency Provider Last Rate Last Dose  . triamcinolone acetonide (KENALOG) 10 MG/ML injection 10 mg  10 mg Other Once [employer], DPM      .  triamcinolone acetonide (KENALOG-40) injection 20 mg  20 mg Other Once [employer], DPM        No Known Allergies  Objective:  General: Alert and oriented x3 in no acute distress  Dermatology:Old surgical scars well healed, No open lesions bilateral lower extremities, no webspace macerations, no ecchymosis bilateral, all nails x 10 are well manicured.  Vascular: Dorsalis Pedis and Posterior Tibial pedal pulses palpable, Capillary Fill Time 3 seconds, (+) pedal hair growth bilateral, no edema bilateral lower extremities, Temperature gradient within normal limits.  Neurology: Gross sensation intact via light touch on right. 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 (- )Tinels sign bilateral.   Musculoskeletal: Moderate tenderness at navicular tuberosity and along posterior tibial tendon course on right, Mild tenderness with palpation at bony prominence at dorsal right>left midfoot,No pain with calf compression bilateral. Midtarsal and ankle joint range of motion limited bilateral, Subtalar joint range of motion is within normal limits, there is no 1st ray hypermobility noted bilateral, mild decreased 1st MPJ rom Right>Left with functional limitus noted on weightbearing exam. Strength within normal limits in all groups bilateral except left.   Xray right and left foot- mild decreased osseous mineralization, midfoot dorsal bone spur with midtarsal breach supportive of arthritis. Mild inferior calcaneal spur. Surgical fixation intact.  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 Post-injection care discussed with patient.  -Rx fascial brace for right -Recommend Epsom soaks, ice, and elevation as needed -Recommend continue with compression stocking for periods of long standing and walking -Recommend continue with good supportive shoes and orthotics.  -Consult to neurology for further eval of left neuritis with weakness  -Long term patient may benefit from assistive bracing on left  -Patient to return to office as needed/after neurology consult or sooner if condition worsens.  [name], DPM

## 2016-09-02 NOTE — Telephone Encounter (Addendum)
-----   Message from [name], [place] sent at 09/02/2016  9:18 AM EST ----- Regarding: Neurology consult  Patient with left sided weakness after nerve damage from hip surgery has had NCV/EMG. Please have her evaluated by a neurologist -Dr. Cannon Kettle. Referral, clinical and Demographics faxed to [facility].

## 2016-09-07 DIAGNOSIS — G5702 Lesion of sciatic nerve, left lower limb: Secondary | ICD-10-CM | POA: Diagnosis not present

## 2016-10-07 ENCOUNTER — Telehealth: Payer: Self-pay | Admitting: *Deleted

## 2016-10-07 DIAGNOSIS — M792 Neuralgia and neuritis, unspecified: Secondary | ICD-10-CM

## 2016-10-07 NOTE — Telephone Encounter (Addendum)
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 Cannon Kettle. 10/07/2016-Faxed referral, clinicals and demographics to [facility].

## 2016-10-14 ENCOUNTER — Encounter: Payer: Self-pay | Admitting: Neurology

## 2016-10-29 ENCOUNTER — Ambulatory Visit (INDEPENDENT_AMBULATORY_CARE_PROVIDER_SITE_OTHER): Payer: Commercial Managed Care - HMO

## 2016-10-29 ENCOUNTER — Encounter: Payer: Self-pay | Admitting: Sports Medicine

## 2016-10-29 ENCOUNTER — Ambulatory Visit (INDEPENDENT_AMBULATORY_CARE_PROVIDER_SITE_OTHER): Payer: Commercial Managed Care - HMO | Admitting: Sports Medicine

## 2016-10-29 DIAGNOSIS — M778 Other enthesopathies, not elsewhere classified: Secondary | ICD-10-CM

## 2016-10-29 DIAGNOSIS — R52 Pain, unspecified: Secondary | ICD-10-CM

## 2016-10-29 DIAGNOSIS — M7750 Other enthesopathy of unspecified foot: Secondary | ICD-10-CM | POA: Diagnosis not present

## 2016-10-29 DIAGNOSIS — M779 Enthesopathy, unspecified: Principal | ICD-10-CM

## 2016-10-29 DIAGNOSIS — M19079 Primary osteoarthritis, unspecified ankle and foot: Secondary | ICD-10-CM | POA: Diagnosis not present

## 2016-10-29 DIAGNOSIS — M898X9 Other specified disorders of bone, unspecified site: Secondary | ICD-10-CM

## 2016-10-29 MED ORDER — DICLOFENAC SODIUM 75 MG PO TBEC: 75.0000 mg | DELAYED_RELEASE_TABLET | Freq: Two times a day (BID) | ORAL | 0 refills | Status: DC

## 2016-10-29 MED ORDER — TRIAMCINOLONE ACETONIDE 10 MG/ML IJ SUSP: 10.0000 mg | Freq: Once | INTRAMUSCULAR | Status: AC

## 2016-10-29 NOTE — Progress Notes (Signed)
Patient ID: Bailey Gomez, female   DOB: [birth year], 56 y.o.   MRN: [id number]   Subjective: Bailey Gomez is a 56 y.o. female patient who returns to office for evaluation of bilateral foot pain. Patient states that she is having pain on top of right foot and side of left ankle, worse since a few days ago after being on her feet with her grandkids. Patient requests injections. Patient denies any other acute changes or problems.   Patient Active Problem List   Diagnosis Date Noted  . Menopausal symptoms 04/12/2013  . Endometrial cancer ([place]) 02/17/2012    Current Outpatient Prescriptions on File Prior to Visit  Medication Sig Dispense Refill  . amLODipine (NORVASC) 10 MG tablet Take 10 mg by mouth daily.  3  . aspirin 81 MG EC tablet     . celecoxib (CELEBREX) 200 MG capsule     . Dermatological Products, Misc. (NUVAIL) SOLN APPLY AT BEDTIME TO NAIL  1  . docusate sodium (COLACE) 100 MG capsule     . HIBICLENS 4 % external liquid APPLY LIQUID TO AFFECTED AREA DAILY, STARTING SEVEN DAYS BEFORE SURGERY  0  . LYRICA 75 MG capsule     . megestrol (MEGACE) 40 MG tablet TAKE 1 TABLET BY MOUTH EVERY DAY 30 tablet 12  . oxyCODONE (OXY IR/ROXICODONE) 5 MG immediate release tablet     . pantoprazole (PROTONIX) 40 MG tablet     . simvastatin (ZOCOR) 5 MG tablet Take 5 mg by mouth at bedtime.     Current Facility-Administered Medications on File Prior to Visit  Medication Dose Route Frequency Provider Last Rate Last Dose  . triamcinolone acetonide (KENALOG) 10 MG/ML injection 10 mg  10 mg Other Once [employer], DPM      . triamcinolone acetonide (KENALOG-40) injection 20 mg  20 mg Other Once [employer], DPM      . triamcinolone acetonide (KENALOG-40) injection 20 mg  20 mg Other Once [employer], DPM        No Known Allergies  Objective:  General: Alert and oriented x3 in no acute distress  Dermatology:Old surgical scars well healed, No open lesions bilateral lower extremities, no  webspace macerations, no ecchymosis bilateral, all nails x 10 are well manicured.  Vascular: Dorsalis Pedis and Posterior Tibial pedal pulses palpable, Capillary Fill Time 3 seconds, (+) pedal hair growth bilateral, no edema bilateral lower extremities, Temperature gradient within normal limits.  Neurology: Gross sensation intact via light touch on right. 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 (- )Tinels sign bilateral.   Musculoskeletal: Moderate tenderness at dorsal lateral midfoot on right and at peroneal tendon course on left ankle,No pain with calf compression bilateral. Midtarsal and ankle joint range of motion limited bilateral, Subtalar joint range of motion is within normal limits, there is no 1st ray hypermobility noted bilateral, mild decreased 1st MPJ rom Right>Left with functional limitus noted on weightbearing exam. Mild guarding at left ankle. Strength within normal limits in all groups bilateral except left.   Xray right and left foot- mild decreased osseous mineralization, midfoot dorsal bone spur with midtarsal breach supportive of arthritis. Mild inferior calcaneal spur. Surgical fixation intact. No new acute findings.  Assessment and Plan: Problem List Items Addressed This Visit    None    Visit Diagnoses    Capsulitis of foot, unspecified laterality    -  Primary   Relevant Medications   diclofenac (VOLTAREN) 75 MG EC tablet   triamcinolone acetonide (  KENALOG) 10 MG/ML injection 10 mg   Pain       Relevant Orders   DG Ankle 2 Views Left   DG Foot 2 Views Right   Bony exostosis       Relevant Medications   diclofenac (VOLTAREN) 75 MG EC tablet   Osteoarthritis of ankle and foot, unspecified laterality       Relevant Medications   diclofenac (VOLTAREN) 75 MG EC tablet   triamcinolone acetonide (KENALOG) 10 MG/ML injection 10 mg     -Complete examination performed -Xrays reviewed -Discussed treatement options for capsulitis and  possible overuse tendonitis with arthritis  -Refilled Diclofenac -After oral consent and aseptic prep, injected a mixture containing 1 ml of 2% plain lidocaine, 1 ml 0.5% plain marcaine, 0.5 ml of kenalog 10 and 0.5 ml of dexamethasone phosphate into right lateral midfoot and at Left ankle peroneal tendon without complication. Post-injection care discussed with patient.  -Continue with fascial brace for right -Recommend Epsom soaks, ice, and elevation as needed -Recommend continue with compression stocking for periods of long standing and walking -Recommend continue with good supportive shoes and orthotics.  -Awaiting Consult to neurology for further eval of left neuritis with weakness; Appt is in June -Long term patient may benefit from assistive bracing on left  -Patient to return to office as needed/after neurology consult or sooner if condition worsens.  [name], DPM

## 2016-11-19 DIAGNOSIS — Z1231 Encounter for screening mammogram for malignant neoplasm of breast: Secondary | ICD-10-CM | POA: Diagnosis not present

## 2016-11-29 ENCOUNTER — Telehealth: Payer: Self-pay | Admitting: Sports Medicine

## 2016-11-29 NOTE — Telephone Encounter (Signed)
Bailey Gomez with [employer] called wanting to know if there are any work or activity restrictions. Please call her back at [phone]. She is also sending a fax in regards to this message.

## 2016-12-01 ENCOUNTER — Telehealth: Payer: Self-pay | Admitting: Sports Medicine

## 2016-12-01 DIAGNOSIS — R928 Other abnormal and inconclusive findings on diagnostic imaging of breast: Secondary | ICD-10-CM | POA: Diagnosis not present

## 2016-12-01 DIAGNOSIS — M79676 Pain in unspecified toe(s): Secondary | ICD-10-CM

## 2016-12-16 NOTE — Progress Notes (Signed)
[facility] Clinic Note - Initial Visit   Date: 12/17/16  Bailey Gomez MRN: [id number] DOB: Mar 12, 1961   Dear Dr. Cannon Kettle:  Thank you for your kind referral of Bailey Gomez for consultation of left foot numbness. Although her history is well known to you, please allow [place] to reiterate it for the purpose of our medical record. The patient was accompanied to the clinic by self.   History of Present Illness: Bailey Gomez is a 56 y.o. African American female with hypertension, GERD, and hyperlipidemia presenting for evaluation of left foot numbness.    She had left hip surgery on 04/29/2016 and immediately following this, she developed numbness of the left great toe and over the following days, the numbness involved her entire sole.  Around the same time, she also started noticing weakness of the foot, especially difficulty flexing and extending the toes and foot.  She did physical therapy for two months for her left hip as well as foot drop which did not provide any benefit.  She also complains of burning, throbbing, achy, and stabbing pain of the feet, worse with weight bearing and needs to take rest breaks often.  She has been tried on gabapentin and Lyrica which did not provide any relief., but does not recall the dose.  She is followed by Dr. Cannon Kettle who is managing her with NSAIDs for arthritis of the foot.   She has been thoroughly evaluated by Amy Alexia Freestone, PA at [facility] neuroscience in February 2018 for these same complaints.  Her previous evaluation includes MRI of the lumbar spine showing disc degeneration at L4-5 without neural impingement. 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 She also had MRI of the pelvis and ultrasound of the sciatic nerve, these results are not available to me, but patient reports that it confirmed nerve injury.   She is here for a second opinion. She is frustrated that she cannot walk without a limp, has pain in the feet, and has persistent weakness of the foot.  Fortunately, she has not suffered any falls.    Past Medical History:  Diagnosis Date  . Complex endometrial hyperplasia with atypia   . 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 ABDOMINAL HYSTERECTOMY  12/23/2010   TAH  . HYSTEROSCOPY W/D&C  10/2010     Medications:  Outpatient Encounter Prescriptions as of 12/17/2016  Medication Sig Note  . amLODipine (NORVASC) 10 MG tablet Take 10 mg by mouth daily. 12/03/2015: Received from: External Pharmacy  . aspirin 81 MG EC tablet  06/10/2016: Received from: External Pharmacy  . celecoxib (CELEBREX) 200 MG capsule  06/10/2016: Received from: External Pharmacy  . Dermatological Products, Misc. (NUVAIL) SOLN APPLY AT BEDTIME TO NAIL 12/03/2015: Received from: External Pharmacy  . diclofenac (VOLTAREN) 75 MG EC tablet Take 1 tablet (75 mg total) by mouth 2 (two) times daily.   Marland Kitchen docusate sodium (COLACE) 100 MG capsule  06/10/2016: Received from: External Pharmacy  . gabapentin (NEURONTIN) 100 MG capsule Take 1 pill at bedtime. Increase as tolerated to 3 pills.   Marland Kitchen LYRICA 75 MG capsule  06/10/2016: Received from: External Pharmacy  . megestrol (MEGACE) 40 MG tablet TAKE 1 TABLET BY MOUTH EVERY DAY   . oxyCODONE (OXY IR/ROXICODONE) 5 MG immediate release tablet  06/10/2016: Received from: External Pharmacy  . pantoprazole (PROTONIX) 40 MG tablet  06/10/2016: Received from: External Pharmacy  . simvastatin (ZOCOR) 5 MG tablet Take 5 mg by mouth at bedtime.   . [DISCONTINUED] HIBICLENS 4 % external liquid APPLY LIQUID TO AFFECTED AREA DAILY, STARTING SEVEN DAYS BEFORE SURGERY 06/10/2016: Received from: External Pharmacy   Facility-Administered Encounter Medications as of 12/17/2016  Medication  . triamcinolone  acetonide (KENALOG) 10 MG/ML injection 10 mg  . triamcinolone acetonide (KENALOG) 10 MG/ML injection 10 mg  . triamcinolone acetonide (KENALOG-40) injection 20 mg  . triamcinolone acetonide (KENALOG-40) injection 20 mg     Allergies: No Known Allergies  Family History: No family history on file.  Social History: Social History  Substance Use Topics  . Smoking status: Never Smoker  . Smokeless tobacco: Never Used  . Alcohol use Yes     Comment: occassional   Social History   Social History Narrative   Lives with husband in a one story home.  Has 2 sons.  Retired from [employer].  Education: high school.  +    Review of Systems:  CONSTITUTIONAL: No fevers, chills, night sweats, or weight loss.   EYES: No visual changes or eye pain ENT: No hearing changes.  No history of nose bleeds.   RESPIRATORY: No cough, wheezing and shortness of breath.   CARDIOVASCULAR: Negative for chest pain, and palpitations.   GI: Negative for abdominal discomfort, blood in stools or black stools.  No recent change in bowel habits.   GU:  No history of incontinence.   MUSCLOSKELETAL: No history of joint pain or swelling.  No myalgias.   SKIN: Negative for lesions, rash, and itching.   HEMATOLOGY/ONCOLOGY: Negative for prolonged bleeding, bruising easily, and swollen nodes.  No history of cancer.   ENDOCRINE: Negative for cold or heat intolerance, polydipsia or goiter.   PSYCH:  No depression or anxiety symptoms.   NEURO: As Above.   Vital Signs:  BP 110/80   Pulse 63   Ht 5\' 2"  (1.575 m)   Wt 143 lb 7 oz (65.1 kg)   SpO2 97%   BMI 26.24 kg/m    General Medical Exam:   General:  Well appearing, comfortable.   Eyes/ENT: see cranial nerve examination.   Neck: No masses appreciated.  Full range of motion without tenderness.  No carotid bruits. Respiratory:  Clear to auscultation, good air entry bilaterally.   Cardiac:  Regular rate and rhythm, no murmur.   Extremities:  No  deformities, edema, or skin discoloration.  Skin:  No rashes or lesions.  Neurological Exam: MENTAL STATUS including orientation to time, place, person, recent and remote memory, attention span and concentration, language, and fund of knowledge is normal.  Speech is not dysarthric.  CRANIAL NERVES: II:  No visual field defects.  Unremarkable fundi.   III-IV-VI: Pupils equal round and reactive to light.  Normal conjugate, extra-ocular eye movements in all directions of gaze.  No nystagmus.  No ptosis.   V:  Normal facial sensation.     VII:  Normal facial symmetry and movements.   VIII:  Normal hearing and vestibular function.   IX-X:  Normal palatal movement.   XI:  Normal shoulder shrug and head rotation.   XII:  Normal tongue strength and range of motion, no deviation or fasciculation.  MOTOR:  Mild left instrinsic foot atrophy.  No fasciculations or abnormal movements.  No pronator drift.  Tone is normal.    Right Upper  Extremity:    Left Upper Extremity:    Deltoid  5/5   Deltoid  5/5   Biceps  5/5   Biceps  5/5   Triceps  5/5   Triceps  5/5   Wrist extensors  5/5   Wrist extensors  5/5   Wrist flexors  5/5   Wrist flexors  5/5   Finger extensors  5/5   Finger extensors  5/5   Finger flexors  5/5   Finger flexors  5/5   Dorsal interossei  5/5   Dorsal interossei  5/5   Abductor pollicis  5/5   Abductor pollicis  5/5   Tone (Ashworth scale)  0  Tone (Ashworth scale)  0   Right Lower Extremity:    Left Lower Extremity:    Hip flexors  5/5   Hip flexors  5/5   Hip extensors  5/5   Hip extensors  5/5   Knee flexors  5/5   Knee flexors  5/5   Knee extensors  5/5   Knee extensors  5/5   Dorsiflexors  5/5   Dorsiflexors  1/5   Plantarflexors  5/5   Plantarflexors  1/5   Toe extensors  5/5   Toe extensors  1/5   Toe flexors  5/5   Toe flexors  1/5   Tone (Ashworth scale)  0  Tone (Ashworth scale)  0   MSRs:  Right                                                                  Left brachioradialis 2+  brachioradialis 2+  biceps 2+  biceps 2+  triceps 2+  triceps 2+  patellar 2+  patellar 2+  ankle jerk 2+  ankle jerk 0  Hoffman no  Hoffman no  plantar response down  plantar response down   SENSORY:  Reduced pin prick, temperature, and vibration at the left foot, intact in the lower leg and thigh.  Sensation is normal on the right leg.  COORDINATION/GAIT: Normal finger-to- nose-finger.  Finger tapping and right toe tapping intact.  She is unable to perform toe tapping on the left.  Surprisingly, her gait does not show high steppage on the left and appears relatively stable without dragging of the foot. She is briefly able to stand on toes on the left, unable to stand on heels.  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    Exam shows diffuse weakness of left foot dorsiflexion, plantarflexion, inversion, and eversion; however, gait pattern is inconsistent to what would be expected with a patient with flail foot. I'm not sure why she can walk relatively well, but yet have profound weakness on motor exam testing, unless effort is playing a role.  I do not have her previously NCS/EMG, [place] nerve, or imaging to review; patient will bring these results to the office  She is understandably frustrated at the lack of improvement and is requesting repeat EDX to assess the nerve injury. I explained that repeating the testing too early may not give any new information and especially if there has not done any physical therapy since her last EDX.  Prognosis for nerve injury was also discussed and with her deficits, I  anticipate she will have incomplete recovery although too early to determine what deficits will be permanent.  At this juncture, the goal is to optimize her neurological recovery with doing physical therapy for foot strengthening.  Check vitamin B12, TSH, folate, and copper to be sure nutrient deficiency  is not playing a role in her neural recovery  She is was taking Lyrica and I offered to increase this dose, but she would like check the dose she previously took and then decide whether to optimize this further.  Return to clinic in 4 months.   The duration of this appointment visit was 60 minutes of face-to-face time with the patient.  Greater than 50% of this time was spent in counseling, explanation of diagnosis, planning of further management, and coordination of care.   Thank you for allowing me to participate in patient's care.  If I can answer any additional questions, I would be pleased to do so.    Sincerely,    Mikeyla Music K. Posey Pronto, DO

## 2016-12-17 ENCOUNTER — Other Ambulatory Visit (INDEPENDENT_AMBULATORY_CARE_PROVIDER_SITE_OTHER): Payer: Commercial Managed Care - HMO

## 2016-12-17 ENCOUNTER — Ambulatory Visit (INDEPENDENT_AMBULATORY_CARE_PROVIDER_SITE_OTHER): Payer: Commercial Managed Care - HMO | Admitting: Neurology

## 2016-12-17 ENCOUNTER — Encounter: Payer: Self-pay | Admitting: Neurology

## 2016-12-17 ENCOUNTER — Telehealth: Payer: Self-pay | Admitting: *Deleted

## 2016-12-17 VITALS — BP 110/80 | HR 63 | Ht 62.0 in | Wt 143.4 lb

## 2016-12-17 DIAGNOSIS — G5702 Lesion of sciatic nerve, left lower limb: Secondary | ICD-10-CM

## 2016-12-17 LAB — VITAMIN B12: VITAMIN B 12: 787 pg/mL (ref 211–911)

## 2016-12-17 LAB — TSH: TSH: 1.59 u[IU]/mL (ref 0.35–4.50)

## 2016-12-17 LAB — FOLATE: FOLATE: 17.9 ng/mL (ref >5.9)

## 2016-12-17 NOTE — Telephone Encounter (Signed)
Patient given results and informed that I will call her when the other result comes in.

## 2016-12-17 NOTE — Patient Instructions (Addendum)
1.  Referral for physical therapy for left sciatic neuropathy 2.  Check labs 3.  Call my office if you would like to try something different for your nerve pain or increase Lyrica  Return to clinic in 4 months

## 2016-12-17 NOTE — Telephone Encounter (Signed)
-----   Message from Cameron Sprang, MD sent at 12/17/2016  3:05 PM EDT ----- Pls let her know the thyroid and B12 levels are normal, office will call her when the copper results are back. Thanks

## 2016-12-20 DIAGNOSIS — M6281 Muscle weakness (generalized): Secondary | ICD-10-CM | POA: Diagnosis not present

## 2016-12-20 DIAGNOSIS — R262 Difficulty in walking, not elsewhere classified: Secondary | ICD-10-CM | POA: Diagnosis not present

## 2016-12-20 DIAGNOSIS — M79672 Pain in left foot: Secondary | ICD-10-CM | POA: Diagnosis not present

## 2016-12-20 LAB — COPPER, SERUM: Copper: 118 ug/dL (ref 70–175)

## 2016-12-21 ENCOUNTER — Telehealth: Payer: Self-pay | Admitting: *Deleted

## 2016-12-21 DIAGNOSIS — J209 Acute bronchitis, unspecified: Secondary | ICD-10-CM | POA: Diagnosis not present

## 2016-12-21 DIAGNOSIS — J0100 Acute maxillary sinusitis, unspecified: Secondary | ICD-10-CM | POA: Diagnosis not present

## 2016-12-21 NOTE — Telephone Encounter (Signed)
Patient given results

## 2016-12-21 NOTE — Telephone Encounter (Signed)
-----   Message from Alda Berthold, DO sent at 12/21/2016  8:14 AM EDT ----- Please inform patient that labs for other causes of neuropathy are normal.  Thanks.

## 2016-12-22 ENCOUNTER — Telehealth: Payer: Self-pay | Admitting: Neurology

## 2016-12-22 DIAGNOSIS — M79672 Pain in left foot: Secondary | ICD-10-CM | POA: Diagnosis not present

## 2016-12-22 DIAGNOSIS — R262 Difficulty in walking, not elsewhere classified: Secondary | ICD-10-CM | POA: Diagnosis not present

## 2016-12-22 DIAGNOSIS — M6281 Muscle weakness (generalized): Secondary | ICD-10-CM | POA: Diagnosis not present

## 2016-12-22 NOTE — Telephone Encounter (Signed)
Caller: [facility]  Urgent? No  Reason for the call: He has this patient there with him now and he would like you to please call him back on getting a solution? Please Call. Thanks

## 2016-12-22 NOTE — Telephone Encounter (Signed)
Called the number [phone] back and they said that no one named Summerlynn Glauser was there.

## 2016-12-23 NOTE — Progress Notes (Signed)
Addendum  Nerve ultrasound 09/07/2016:   Ultrasound of the left sciatic nerve showed enlargement decreased echogenicity when compared to the right. There was no axonal discontinuity or compressive structures. The right sciatic nerve had normal appearance.  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 No right hip fracture or dislocation.  EMG report dated 08/12/2016 performed at [place]:  There is electrophysiologic evidence of an incomplete left sciatic neuropathy at or above the hip. There is also reinnervation seen in the gluteus medius which may be due to focal muscle damage or damaged superior gluteal nerve during surgery. The patient had trouble understanding her to activate the gluteus maximus but no denervation was seen.  These reports will be scanned into the patient's medical chart.

## 2016-12-27 DIAGNOSIS — R262 Difficulty in walking, not elsewhere classified: Secondary | ICD-10-CM | POA: Diagnosis not present

## 2016-12-27 DIAGNOSIS — M79672 Pain in left foot: Secondary | ICD-10-CM | POA: Diagnosis not present

## 2016-12-27 DIAGNOSIS — M6281 Muscle weakness (generalized): Secondary | ICD-10-CM | POA: Diagnosis not present

## 2016-12-30 DIAGNOSIS — M6281 Muscle weakness (generalized): Secondary | ICD-10-CM | POA: Diagnosis not present

## 2016-12-30 DIAGNOSIS — R262 Difficulty in walking, not elsewhere classified: Secondary | ICD-10-CM | POA: Diagnosis not present

## 2016-12-30 DIAGNOSIS — M79672 Pain in left foot: Secondary | ICD-10-CM | POA: Diagnosis not present

## 2017-01-03 DIAGNOSIS — M79672 Pain in left foot: Secondary | ICD-10-CM | POA: Diagnosis not present

## 2017-01-03 DIAGNOSIS — R262 Difficulty in walking, not elsewhere classified: Secondary | ICD-10-CM | POA: Diagnosis not present

## 2017-01-03 DIAGNOSIS — M6281 Muscle weakness (generalized): Secondary | ICD-10-CM | POA: Diagnosis not present

## 2017-01-05 DIAGNOSIS — M6281 Muscle weakness (generalized): Secondary | ICD-10-CM | POA: Diagnosis not present

## 2017-01-05 DIAGNOSIS — R262 Difficulty in walking, not elsewhere classified: Secondary | ICD-10-CM | POA: Diagnosis not present

## 2017-01-05 DIAGNOSIS — M79672 Pain in left foot: Secondary | ICD-10-CM | POA: Diagnosis not present

## 2017-01-10 DIAGNOSIS — M6281 Muscle weakness (generalized): Secondary | ICD-10-CM | POA: Diagnosis not present

## 2017-01-10 DIAGNOSIS — R262 Difficulty in walking, not elsewhere classified: Secondary | ICD-10-CM | POA: Diagnosis not present

## 2017-01-10 DIAGNOSIS — M79672 Pain in left foot: Secondary | ICD-10-CM | POA: Diagnosis not present

## 2017-01-13 DIAGNOSIS — R262 Difficulty in walking, not elsewhere classified: Secondary | ICD-10-CM | POA: Diagnosis not present

## 2017-01-13 DIAGNOSIS — M79672 Pain in left foot: Secondary | ICD-10-CM | POA: Diagnosis not present

## 2017-01-13 DIAGNOSIS — M6281 Muscle weakness (generalized): Secondary | ICD-10-CM | POA: Diagnosis not present

## 2017-01-18 DIAGNOSIS — M6281 Muscle weakness (generalized): Secondary | ICD-10-CM | POA: Diagnosis not present

## 2017-01-18 DIAGNOSIS — M79672 Pain in left foot: Secondary | ICD-10-CM | POA: Diagnosis not present

## 2017-01-18 DIAGNOSIS — R262 Difficulty in walking, not elsewhere classified: Secondary | ICD-10-CM | POA: Diagnosis not present

## 2017-01-20 DIAGNOSIS — R262 Difficulty in walking, not elsewhere classified: Secondary | ICD-10-CM | POA: Diagnosis not present

## 2017-01-20 DIAGNOSIS — M79672 Pain in left foot: Secondary | ICD-10-CM | POA: Diagnosis not present

## 2017-01-20 DIAGNOSIS — M6281 Muscle weakness (generalized): Secondary | ICD-10-CM | POA: Diagnosis not present

## 2017-01-26 DIAGNOSIS — Z01419 Encounter for gynecological examination (general) (routine) without abnormal findings: Secondary | ICD-10-CM | POA: Diagnosis not present

## 2017-01-26 DIAGNOSIS — C541 Malignant neoplasm of endometrium: Secondary | ICD-10-CM | POA: Diagnosis not present

## 2017-02-08 ENCOUNTER — Encounter: Payer: Self-pay | Admitting: Sports Medicine

## 2017-02-08 ENCOUNTER — Ambulatory Visit (INDEPENDENT_AMBULATORY_CARE_PROVIDER_SITE_OTHER): Payer: 59 | Admitting: Sports Medicine

## 2017-02-08 VITALS — BP 112/78 | HR 70 | Resp 16

## 2017-02-08 DIAGNOSIS — M778 Other enthesopathies, not elsewhere classified: Secondary | ICD-10-CM

## 2017-02-08 DIAGNOSIS — M898X9 Other specified disorders of bone, unspecified site: Secondary | ICD-10-CM

## 2017-02-08 DIAGNOSIS — R52 Pain, unspecified: Secondary | ICD-10-CM

## 2017-02-08 DIAGNOSIS — R29898 Other symptoms and signs involving the musculoskeletal system: Secondary | ICD-10-CM

## 2017-02-08 DIAGNOSIS — M7751 Other enthesopathy of right foot: Secondary | ICD-10-CM | POA: Diagnosis not present

## 2017-02-08 DIAGNOSIS — M792 Neuralgia and neuritis, unspecified: Secondary | ICD-10-CM

## 2017-02-08 DIAGNOSIS — M7672 Peroneal tendinitis, left leg: Secondary | ICD-10-CM

## 2017-02-08 DIAGNOSIS — M19079 Primary osteoarthritis, unspecified ankle and foot: Secondary | ICD-10-CM

## 2017-02-08 DIAGNOSIS — M779 Enthesopathy, unspecified: Principal | ICD-10-CM

## 2017-02-08 MED ORDER — DICLOFENAC SODIUM 75 MG PO TBEC: 75.0000 mg | DELAYED_RELEASE_TABLET | Freq: Two times a day (BID) | ORAL | 2 refills | Status: DC

## 2017-02-08 MED ORDER — TRIAMCINOLONE ACETONIDE 10 MG/ML IJ SUSP: 10.0000 mg | Freq: Once | INTRAMUSCULAR | Status: AC

## 2017-02-08 NOTE — Progress Notes (Signed)
Patient ID: Bailey Gomez, female   DOB: January 11, 1961, 56 y.o.   MRN: [id number]   Subjective: Bailey Gomez is a 56 y.o. female patient who returns to office for evaluation of bilateral foot pain. Patient states that she is having pain on top of right foot and side of left ankle, worse since after starting physical therapy as referred by the neurologist. Patient requests injections. Patient denies any other acute changes or problems.   Patient Active Problem List   Diagnosis Date Noted  . 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 Menopausal symptoms 04/12/2013  . Endometrial cancer ([place]) 02/17/2012    Current Outpatient Prescriptions on File Prior to Visit  Medication Sig Dispense Refill  . amLODipine (NORVASC) 10 MG tablet Take 10 mg by mouth daily.  3  . aspirin 81 MG EC tablet     . celecoxib (CELEBREX) 200 MG capsule     . Dermatological Products, Misc. (NUVAIL) SOLN APPLY AT BEDTIME TO NAIL  1  . docusate sodium (COLACE) 100 MG capsule     . gabapentin (NEURONTIN) 100 MG capsule Take 1 pill at bedtime. Increase as tolerated to 3 pills.    Marland Kitchen LYRICA 75 MG capsule     . megestrol (MEGACE) 40 MG tablet TAKE 1 TABLET BY MOUTH EVERY DAY 30 tablet 12  . pantoprazole (PROTONIX) 40 MG tablet     . simvastatin (ZOCOR) 5 MG tablet Take 5 mg by mouth at bedtime.     Current Facility-Administered Medications on File Prior to Visit  Medication Dose Route Frequency Provider Last Rate Last Dose  . triamcinolone acetonide (KENALOG) 10 MG/ML injection 10 mg  10 mg Other Once [name], DPM      . triamcinolone acetonide (KENALOG) 10 MG/ML injection 10 mg  10 mg Other Once [place], Asiyah Pineau, DPM      . triamcinolone acetonide (KENALOG-40) injection 20 mg  20 mg Other Once [place], Hansini Clodfelter, DPM      . triamcinolone acetonide (KENALOG-40) injection 20 mg  20 mg Other Once [name], DPM        No Known Allergies  Objective:  General: Alert and oriented x3 in no acute  distress  Dermatology:Old surgical scars well healed, No open lesions bilateral lower extremities, no webspace macerations, no ecchymosis bilateral, all nails x 10 are well manicured.  Vascular: Dorsalis Pedis and Posterior Tibial pedal pulses palpable, Capillary Fill Time 3 seconds, (+) pedal hair growth bilateral, no edema bilateral lower extremities, Temperature gradient within normal limits.  Neurology: Gross sensation intact via light touch on right. 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 (- )Tinels sign bilateral.   Musculoskeletal: Moderate tenderness at dorsal lateral midfoot on right and at peroneal tendon course on left ankle. Same as last encounter,No pain with calf compression bilateral. Midtarsal and ankle joint range of motion limited bilateral, Subtalar joint range of motion is within normal limits, there is no 1st ray hypermobility noted bilateral, mild decreased 1st MPJ rom Right>Left with functional limitus noted on weightbearing exam. Mild guarding at left ankle. Strength within normal limits in all groups bilateral except left.   Assessment and Plan: Problem List Items Addressed This Visit    None    Visit Diagnoses    Capsulitis of foot, unspecified laterality    -  Primary   Right dorsal midfoot   Relevant Medications   diclofenac (VOLTAREN) 75 MG EC tablet   triamcinolone acetonide (KENALOG) 10 MG/ML injection 10 mg (Start on  02/08/2017 12:45 PM)   Bony exostosis       Relevant Medications   diclofenac (VOLTAREN) 75 MG EC tablet   triamcinolone acetonide (KENALOG) 10 MG/ML injection 10 mg (Start on 02/08/2017 12:45 PM)   Osteoarthritis of ankle and foot, unspecified laterality       Relevant Medications   diclofenac (VOLTAREN) 75 MG EC tablet   triamcinolone acetonide (KENALOG) 10 MG/ML injection 10 mg (Start on 02/08/2017 12:45 PM)   Pain       Relevant Medications   triamcinolone acetonide (KENALOG) 10 MG/ML injection 10 mg (Start on  02/08/2017 12:45 PM)   Neuritis       Peroneal tendinitis, left       Relevant Medications   triamcinolone acetonide (KENALOG) 10 MG/ML injection 10 mg (Start on 02/08/2017 12:45 PM)   Weakness of left lower extremity         -Complete examination performed -Previous Xrays reviewed -Re-Discussed treatement options for capsulitis and possible overuse tendonitis with arthritis  -Refilled Diclofenac -After oral consent and aseptic prep, injected a mixture containing 1 ml of 2% plain lidocaine, 1 ml 0.5% plain marcaine, 0.5 ml of kenalog 10 and 0.5 ml of dexamethasone phosphate into right lateral midfoot and at Left ankle peroneal tendon without complication. Post-injection care discussed with patient.  -Continue with fascial brace for right -Recommend Epsom soaks, ice, and elevation as needed -Recommend continue with compression stocking for periods of long standing and walking -Recommend continue with good supportive shoes and orthotics.  - Continue with neurology recommendations and PT to tolerance  To help build strength since patient has significant nerve weakness -Long term patient may benefit from assistive bracing on left  -Patient to return to office as needed or sooner if problems or issues arise or condition worsens. Advised patient if she continues to have flareups like this may benefit  short-term from a CAM boot; Patient declined this visit and states that she will like to wait and see how these injections help.  [name], DPM

## 2017-02-10 DIAGNOSIS — M79673 Pain in unspecified foot: Secondary | ICD-10-CM

## 2017-03-02 ENCOUNTER — Ambulatory Visit (INDEPENDENT_AMBULATORY_CARE_PROVIDER_SITE_OTHER): Payer: 59 | Admitting: Sports Medicine

## 2017-03-02 DIAGNOSIS — R52 Pain, unspecified: Secondary | ICD-10-CM

## 2017-03-02 DIAGNOSIS — M898X9 Other specified disorders of bone, unspecified site: Secondary | ICD-10-CM

## 2017-03-02 DIAGNOSIS — M779 Enthesopathy, unspecified: Secondary | ICD-10-CM

## 2017-03-02 DIAGNOSIS — M7750 Other enthesopathy of unspecified foot: Secondary | ICD-10-CM

## 2017-03-02 DIAGNOSIS — R29898 Other symptoms and signs involving the musculoskeletal system: Secondary | ICD-10-CM

## 2017-03-02 DIAGNOSIS — M76821 Posterior tibial tendinitis, right leg: Secondary | ICD-10-CM

## 2017-03-02 DIAGNOSIS — M7672 Peroneal tendinitis, left leg: Secondary | ICD-10-CM

## 2017-03-02 DIAGNOSIS — M792 Neuralgia and neuritis, unspecified: Secondary | ICD-10-CM

## 2017-03-02 DIAGNOSIS — M778 Other enthesopathies, not elsewhere classified: Secondary | ICD-10-CM

## 2017-03-02 DIAGNOSIS — M19079 Primary osteoarthritis, unspecified ankle and foot: Secondary | ICD-10-CM

## 2017-03-02 NOTE — Progress Notes (Signed)
Patient ID: Bailey Gomez, female   DOB: 1961-03-11, 56 y.o.   MRN: [id number]   Subjective: Bailey Gomez is a 55 y.o. female patient who returns to office for evaluation of bilateral foot pain. Patient states that she doing ok after the injection last visit but desires to discuss disability.    Patient Active Problem List   Diagnosis Date Noted  . 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 Menopausal symptoms 04/12/2013  . Endometrial cancer ([place]) 02/17/2012    Current Outpatient Prescriptions on File Prior to Visit  Medication Sig Dispense Refill  . amLODipine (NORVASC) 10 MG tablet Take 10 mg by mouth daily.  3  . aspirin 81 MG EC tablet     . celecoxib (CELEBREX) 200 MG capsule     . Dermatological Products, Misc. (NUVAIL) SOLN APPLY AT BEDTIME TO NAIL  1  . diclofenac (VOLTAREN) 75 MG EC tablet Take 1 tablet (75 mg total) by mouth 2 (two) times daily. 30 tablet 2  . docusate sodium (COLACE) 100 MG capsule     . gabapentin (NEURONTIN) 100 MG capsule Take 1 pill at bedtime. Increase as tolerated to 3 pills.    Marland Kitchen LYRICA 75 MG capsule     . megestrol (MEGACE) 40 MG tablet TAKE 1 TABLET BY MOUTH EVERY DAY 30 tablet 12  . pantoprazole (PROTONIX) 40 MG tablet     . simvastatin (ZOCOR) 5 MG tablet Take 5 mg by mouth at bedtime.     Current Facility-Administered Medications on File Prior to Visit  Medication Dose Route Frequency Provider Last Rate Last Dose  . triamcinolone acetonide (KENALOG) 10 MG/ML injection 10 mg  10 mg Other Once [name], DPM      . triamcinolone acetonide (KENALOG) 10 MG/ML injection 10 mg  10 mg Other Once [name], DPM      . triamcinolone acetonide (KENALOG) 10 MG/ML injection 10 mg  10 mg Other Once [place], Yomaris Palecek, DPM      . triamcinolone acetonide (KENALOG-40) injection 20 mg  20 mg Other Once [place], Elbony Mcclimans, DPM      . triamcinolone acetonide (KENALOG-40) injection 20 mg  20 mg Other Once [name], DPM        No Known  Allergies  Objective:  General: Alert and oriented x3 in no acute distress  No physical exam done at today's visit since there were no acute changes since 02-08-17 visit.  Assessment and Plan: Problem List Items Addressed This Visit    None    Visit Diagnoses    Weakness of left lower extremity    -  Primary   Capsulitis of foot, unspecified laterality       Bony exostosis       Osteoarthritis of ankle and foot, unspecified laterality       Pain       Neuritis       Peroneal tendinitis, left       Posterior tibial tendinitis of right leg         -Discuss with patient long term outcomes and functional capacity  -Re-Discussed treatement options for capsulitis and possible overuse tendonitis with arthritis and weakness secondary to nerve injury -Completed disability paperwork on patient's behalf; patient does not have the functional capacity to do any type of work beyond sedentary activities  -Continue with Diclofenac -Continue with fascial brace for right -Continue with Epsom soaks, ice, and elevation as needed for foot pain -Recommend continue with compression stocking for standing and walking -  Recommend continue with good supportive shoes and orthotics.  - Continue with neurology recommendations and PT to tolerance to help build strength since patient has significant nerve weakness  -Long term patient may benefit from assistive bracing on left of which she will discuss with neurologis  -Patient to return to office as needed or sooner if problems or issues arise or condition worsens.  [name], DPM

## 2017-03-23 ENCOUNTER — Ambulatory Visit (HOSPITAL_BASED_OUTPATIENT_CLINIC_OR_DEPARTMENT_OTHER): Payer: Commercial Managed Care - HMO | Admitting: Genetics

## 2017-03-23 ENCOUNTER — Encounter: Payer: Self-pay | Admitting: Genetics

## 2017-03-23 ENCOUNTER — Other Ambulatory Visit: Payer: Commercial Managed Care - HMO

## 2017-03-23 DIAGNOSIS — Z803 Family history of malignant neoplasm of breast: Secondary | ICD-10-CM

## 2017-03-23 DIAGNOSIS — C541 Malignant neoplasm of endometrium: Secondary | ICD-10-CM | POA: Diagnosis not present

## 2017-03-23 DIAGNOSIS — Z800 Family history of malignant neoplasm of digestive organs: Secondary | ICD-10-CM | POA: Diagnosis not present

## 2017-03-23 HISTORY — DX: Family history of malignant neoplasm of breast: Z80.3

## 2017-03-23 NOTE — Progress Notes (Addendum)
REFERRING PROVIDER: Ronaldo Miyamoto, MD No address on file  PRIMARY PROVIDER:  Ronaldo Miyamoto, MD  PRIMARY REASON FOR VISIT:  1. Endometrial cancer ([place])   2. Family history of Lynch syndrome   3. Family history of breast cancer     HISTORY OF PRESENT ILLNESS:   Ms. Heiser, a 56 y.o. female, was seen for a [facility] cancer genetics consultation referred by her sister who was found to have a MSH6 [id number] (p.Ile944Metfs*4) mutation.  Ms. Rumberger also has a personal history of uterine cancer.  Ms. Donnelly presents to clinic today to discuss the possibility of a hereditary predisposition to cancer, genetic testing, and to further clarify her future cancer risks, as well as potential cancer risks for family members.   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  This was treated with a hysterectomy. (ovaries reportedly still intact)  HORMONAL RISK FACTORS:  Menarche was at age 25 First live birth at age 54.  OCP use for approximately 1-2 years.  Ovaries intact: yes.  Hysterectomy: yes.  Menopausal status: perimenopausal. Started at 8, currently in menopause HRT use: 0 years. Colonoscopy: yes; reportedly normal. -told to repeat in 10 years Mammogram within the last year: yes. Twice in the past year to follow a lesion that was seen.  No biopsy taken. Number of breast biopsies: 0.   Past Medical History:  Diagnosis Date  . Complex endometrial hyperplasia with atypia   . 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 Family history of breast cancer 03/23/2017  . Family history of Lynch syndrome     Past Surgical History:  Procedure Laterality Date  . ABDOMINAL HYSTERECTOMY  12/23/2010   TAH  . HYSTEROSCOPY W/D&C  10/2010    Social History   Social History  . Marital status: Married    Spouse name: N/A  . Number of children: 2  . Years of education: 12   Occupational History  . retired      Social History Main Topics  . Smoking status: Never Smoker  . Smokeless tobacco: Never Used  . Alcohol use Yes     Comment: occassional  . Drug use: No  . Sexual activity: Yes   Other Topics Concern  . Not on file   Social History Narrative   Lives with husband in a one story home.  Has 2 sons.  Retired from [employer].  Education: high school.  +     FAMILY HISTORY:  We obtained a detailed, 4-generation family history.  Significant diagnoses are listed below: Family History  Problem Relation Age of Onset  . Aneurysm Mother        died at 35  . Heart attack Father        died at 17  . Breast cancer Sister 99       brain mets   Ms. Beynon has 2 sons ages 33 and 60 with no history of cancer.  [name] has 4 sisters listed below: -1 sister was diagnosed with triple negative breast cancer at 29.  This sister had genetic testing that revealed a MSH6 [id number] (p.Ile944Metfs*4) pathogenic mutation.  This sister has an 33 year-old son.  -1 sister is 44 with no history of cancer.  She has 2 daughters (ages 66 and 16).  -1 sister is 2 with no history of cancer.  She has a 52 year-old daughter and a 77  year-old son.   -1 sister is 78 with no history of cancer.  She had genetic testing for the familial MSH6 mutation and was negative.    Ms. Pichon father died at 72 due to a heart attack.  Ms. Giddings has 2 paternal aunts who are still living with no history of cancer and a paternal uncle who died in his 59's due to an accident.  Ms. Surrette has several paternal cousins with no history of cancer.  Ms. Bartle paternal grandparents are deceased but no further information is known about them.   Ms. Searing mother died from an aneurysm at 16.  She had no history of cancer.  Ms. Ghazarian has 3 maternal aunts and 5 maternal uncles listed below: -2 maternal aunts are still living with no history of cancer.   -1 maternal aunt is deceased with no history of cancer.  -2  maternal uncles died at ages older than 8 and had no history of cancer.  -1 maternal uncle died at 33 due to lung cancer.  -2 maternal uncles are still living with no history of cancer.  Ms. Haigh has several maternal cousins, none that have had any history of cancer.  Ms. Goda maternal grandfather died at 23 with no history of cancer.  Ms. Stemmer maternal grandmother died at 46 due to an unknown type of cancer (possibly stomach cancer).  She possibly had stomach cancer.   Ms. Mauriello is aware of her sister's genetic testing and positive result of a MSH6 mutation. of previous family history of genetic testing for hereditary cancer risks. Patient's maternal ancestors are of Native American/Black descent, and paternal ancestors are of Black descent. There is no reported Ashkenazi Jewish ancestry. There is no known consanguinity.  GENETIC COUNSELING ASSESSMENT: Ellington Cornia is a 56 y.o. female with a personal history of cancer and a family history of Lynch  Syndrome (MSH6 [id number] ) We, therefore, discussed and recommended the following at today's visit.   DISCUSSION: We reviewed the characteristics, features and inheritance patterns of hereditary cancer syndromes. We also discussed genetic testing, including the appropriate family members to test, the process of testing, insurance coverage and turn-around-time for results. We discussed the implications of a negative, positive and/or variant of uncertain significant result. We recommended [name] pursue genetic testing for the Common Hereditary Cancer gene panel. (Which will include analysis of the MSH6 familial mutation). The Hereditary Gene Panel offered by [facility] includes sequencing and/or deletion duplication testing of the following 46 genes: APC, ATM, AXIN2, BARD1, BMPR1A, BRCA1, BRCA2, BRIP1, CDH1, CDKN2A (p14ARF), CDKN2A (p16INK4a), CHEK2, CTNNA1, DICER1, EPCAM (Deletion/duplication testing only), GREM1 (promoter region  deletion/duplication testing only), KIT, MEN1, MLH1, MSH2, MSH3, MSH6, MUTYH, NBN, NF1, NHTL1, PALB2, PDGFRA, PMS2, POLD1, POLE, PTEN, RAD50, RAD51C, RAD51D, SDHB, SDHC, SDHD, SMAD4, SMARCA4. STK11, TP53, TSC1, TSC2, and VHL.  The following genes were evaluated for sequence changes only: SDHA and HOXB13 c.251G>A variant only.  We discussed that only 5-10% of cancers are associated with a Hereditary Cancer Predisposition Syndrome.  Her sister was found to have a mutation in one of her MSH6 genes.  Mutations in this gene cause autosomal dominan Lynch Syndrome.  Lynch Syndrome is caused by mutations in the genes: MLH1, MSH2, MSH6, PMS2 and EPCAM.  This syndrome increases the risk for colon, uterine, ovarian and stomach cancers, as well as others.  Families with Lynch Syndrome tend to have multiple family members with these cancers, typically diagnosed under age 76, and diagnoses in multiple generations.  SCREENING RECOMMENDATIONS for Individuals with Lynch Syndrome: We discussed the implications of Lynch syndrome for [name], if she is identified to have the familial mutation and discussed who else in the family should have genetic testing.  The following would be the screening recommendations should she test positive for the familial MSH6 mutation.  Colon cancer:              - Colonoscopy every 1-2 years starting at 45-47 years of age, or 2-5 years prior to the earliest colon cancer if diagnosed before age 52. Gynecological cancer:              - Prophylactic hysterectomy and bilateral salpingo-oophorectomy (BSO) may be considered by women after childbearing is complete. However, There is insufficient evidence to recommend BSO in individuals who have a mutation specifically in the MSH6 gene.            Serum CA-125 for ovarian cancer and transvaginal ultrasound for ovarian and endometrial cancer may be considered at the clinician's discretion. These modalities have not been shown to be sufficiently  sensitive or specific to support a positive recommendation.  Other extra-colonic cancers             - While there is no clear evidence to support screening for gastric, duodenal, and small bowel cancer, select individuals, families, or those of Asian descent may     consider esophagogastroduodenoscopy (EGD) with extended duodenoscopy every 3-5 years beginning at age 37-60 years of age.             - Consider testing for and treating H. pylori.             - Consider annual urinalysis beginning at 47-84 years of age to screen for urothelial cancer.             - Consider annual physical/neurologic examination starting at 62-63 years of age.  NCCN states that no screening recommendation can be made for pancreatic cancer at this time (*NCCN. Genetic/Familial High-Risk Assessment: Colorectal. Version 2.2016). In contrast, the [employer] of Gastroenterology Clinical Guidelines recommend pancreatic cancer screening for individuals with Lynch syndrome with a first- or second-degree relative affected with pancreatic cancer (PMID: 78295621, 30865784). Ideally, screening should be performed in experienced centers utilizing a multidisciplinary approach under research conditions. Recommended screening includes annual endoscopic ultrasound and/or MRI of the pancreas starting at age 87 or 70 years younger than the earliest age of pancreatic cancer diagnosis in the family (PMID: 69629528). 1.  Annual colonoscopy beginning at age 71-25 or 2-5 years prior to the earliest colon cancer diagnosis.   2. While there is no clear evidence to support screening for stomach and small bowel cancer, an upper endoscopy can be considered at 3-5 year intervals beginning at age 32-35. However, whether to have this screening is best determined by the gastroenterologist.   3.  Annual urinalysis beginning at age 43-30.  For women with Lynch syndrome, unlike the effective surveillance plan for colorectal cancer risk, there is  no professional agreement regarding management for the increased risk of uterine and ovarian cancer. However, we are available to help women and their providers establish an individualized surveillance plan. It is also important for women to understand the following:   1. Women should seek medical attention if they experience abnormal vaginal bleeding.   2.  Some providers may still recommend vaginal ultrasounds, uterine biopsies (for uterine cancer risk) and/or CA-125 analysis ( for ovarian cancer risk), even though these have not  been shown to be effective.   3. A hysterectomy with removal of the ovaries and fallopian tubes should be considered once childbearing is completed (if planned).  We discussed that there are several other genes that are associated with an increased risk for colon, uterine, and other types of cancer.  We discussed that if she is found to have a mutation in one of these other caner risk genes, it may impact future medical management recommendations such as increased cancer screenings and consideration of risk reducing surgeries.  A positive result could also have implications for the patient's family members.  A Negative result would mean that Ms. Nethery does not carry the familial MSH6 gene mutation, and is likely at the average population risk to develop colon cancer.  However, we would still be unable to identify a hereditary component to her personal history of uterine cancer.  Genetic testing does not rule out the possibility of a hereditary basis for her cancer.  There could be mutations that are undetectable by current technology, or in genes not yet tested or identified to increase cancer risk.    We will get a definitive answer as to weather Ms. Standen has the familial MSH6 mutation.  However, when analyzing the other genes on the panel it is possible to find a Variant of Uncertain Significance or VUS.  These are variants that have not yet been identified as pathogenic  or benign, and it is unknown if this variant is associated with increased cancer risk or if this is a normal finding.  Most VUS's are reclassified to benign or likely benign.   It should not be used to make medical management decisions. With time, we suspect the lab will determine the significance of any VUS's identified if any.   Due to her family history of breast cancer, Ms. Colello may still be at an above average risk to develop breast cancer in her lifetime.   Based on the patient's personal and family history, the statistical model Tobey Bride Cusik   Was used to estimate her risk of developing breast cancer. This estimates her lifetime risk of developing breast cancer to be approximately 9.8%-16.3%. (This range is due to unknown breast density.  9.8% is the calculation with unk breast density.  The patient reported that she was told her breasts were 'dense'.  16.3% is the risk estimation if her breasts were the highest density category D).This estimation does not take into account any of Ms. Paulette's genetic testing results.  The patient's lifetime breast cancer risk is a preliminary estimate based on available information using one of several models endorsed by the [place] (ACS). The ACS recommends consideration of breast MRI screening as an adjunct to mammography for patients at high risk (defined as 20% or greater lifetime risk). A more detailed breast cancer risk assessment can be considered, if clinically indicated. Based on this calculation Ms. Burr is not considered considered to be in the 'high risk' for breast cancer for which breast MRI is recommended.  However, she should continue to have routine breast screening and screening as recommended by her physicians.   Based on Ms. Castile's personal and family history of cancer and a known MSH6 familial mtuation, she meets medical criteria for genetic testing. Despite that she meets criteria, she may still have an out of pocket cost. We  discussed that if her out of pocket cost for testing is over $100, the laboratory will call and confirm whether she wants to proceed with  testing.  If the out of pocket cost of testing is less than $100 she will be billed by the genetic testing laboratory.   We discussed that some people do not want to undergo genetic testing due to fear of genetic discrimination.  A federal law called the Genetic Information Non-Discrimination Act (GINA) of 2008 helps protect individuals against genetic discrimination based on their genetic test results.  It impacts both health insurance and employment.  For health insurance, it protects against increased premiums, being kicked off insurance or being forced to take a test in order to be insured.  For employment it protects against hiring, firing and promoting decisions based on genetic test results.  Health status due to a cancer diagnosis is not protected under GINA.  This law does not protect life insurance, disability insurance, or other types of insurance.    PLAN: After considering the risks, benefits, and limitations, Ms. Amundson  provided informed consent to pursue genetic testing and the blood sample was sent to [facility] for analysis of the Common Hereditary Cancer Panel. Results should be available within approximately 2-3 weeks' time, at which point they will be disclosed by telephone to [name], as will any additional recommendations warranted by these results. Ms. Henrickson will receive a summary of her genetic counseling visit and a copy of her results once available. This information will also be available in Epic. We encouraged [name] to remain in contact with cancer genetics annually so that we can continuously update the family history and inform her of any changes in cancer genetics and testing that may be of benefit for her family. Ms. Silliman questions were answered to her satisfaction today. Our contact information was provided should  additional questions or concerns arise.  Based on Ms. Pooler's family history of a known mutation, we recommended her siblings (and nieces/nephews if their parents decline), as well as all maternal and paternal relatives have genetic counseling and genetic testing.  At this time we do not know if the familial MSH6 mutation was inherited through Ms. Jons's maternal or paternal family.  Therefore testing is recommended to relatives on both sides of her family until we are able to determine which side of the family this mutation was inherited from.   Ms. Furr will let us know if we can be of any assistance in coordinating genetic counseling and/or testing for this family member.   Lastly, we encouraged [name] to remain in contact with cancer genetics annually so that we can continuously update the family history and inform her of any changes in cancer genetics and testing that may be of benefit for this family.   Ms.  Voit questions were answered to her satisfaction today. Our contact information was provided should additional questions or concerns arise. Thank you for the referral and allowing [place] to share in the care of your patient.   Tana Felts, MS Genetic Counselor [email or web address] phone: [phone]  The patient was seen for a total of 30 minutes in face-to-face genetic counseling.

## 2017-03-30 NOTE — Telephone Encounter (Signed)
No additional notes

## 2017-04-07 ENCOUNTER — Ambulatory Visit (INDEPENDENT_AMBULATORY_CARE_PROVIDER_SITE_OTHER): Payer: 59 | Admitting: Sports Medicine

## 2017-04-07 DIAGNOSIS — L600 Ingrowing nail: Secondary | ICD-10-CM

## 2017-04-07 DIAGNOSIS — M79675 Pain in left toe(s): Secondary | ICD-10-CM

## 2017-04-07 NOTE — Progress Notes (Signed)
Subjective: Bailey Gomez is a 56 y.o. female patient presents to office today complaining of a painful incurvated, red, hot, swollen lateral nail border of the 1st toe on the left foot. This has been present for months. Patient has treated this by self trimming. Patient denies fever/chills/nausea/vomitting/any other related constitutional symptoms at this time.  Patient Active Problem List   Diagnosis Date Noted  . Family history of Lynch syndrome 03/23/2017  . Family history of breast cancer 03/23/2017  . 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 Menopausal symptoms 04/12/2013  . Endometrial cancer ([place]) 02/17/2012    Current Outpatient Prescriptions on File Prior to Visit  Medication Sig Dispense Refill  . amLODipine (NORVASC) 10 MG tablet Take 10 mg by mouth daily.  3  . aspirin 81 MG EC tablet     . celecoxib (CELEBREX) 200 MG capsule     . Dermatological Products, Misc. (NUVAIL) SOLN APPLY AT BEDTIME TO NAIL  1  . diclofenac (VOLTAREN) 75 MG EC tablet Take 1 tablet (75 mg total) by mouth 2 (two) times daily. 30 tablet 2  . docusate sodium (COLACE) 100 MG capsule     . gabapentin (NEURONTIN) 100 MG capsule Take 1 pill at bedtime. Increase as tolerated to 3 pills.    Marland Kitchen LYRICA 75 MG capsule     . megestrol (MEGACE) 40 MG tablet TAKE 1 TABLET BY MOUTH EVERY DAY 30 tablet 12  . pantoprazole (PROTONIX) 40 MG tablet     . simvastatin (ZOCOR) 5 MG tablet Take 5 mg by mouth at bedtime.     Current Facility-Administered Medications on File Prior to Visit  Medication Dose Route Frequency Provider Last Rate Last Dose  . triamcinolone acetonide (KENALOG) 10 MG/ML injection 10 mg  10 mg Other Once [name], DPM      . triamcinolone acetonide (KENALOG) 10 MG/ML injection 10 mg  10 mg Other Once [name], DPM      . triamcinolone acetonide (KENALOG) 10 MG/ML injection 10 mg  10 mg Other Once [place], Trenika Hudson, DPM      . triamcinolone acetonide (KENALOG-40) injection 20 mg  20  mg Other Once [place], Renwick Asman, DPM      . triamcinolone acetonide (KENALOG-40) injection 20 mg  20 mg Other Once [name], DPM        No Known Allergies  Objective:  There were no vitals filed for this visit.  General: Well developed, nourished, in no acute distress, alert and oriented x3   Dermatology: Skin is warm, dry and supple bilateral. Left hallux nail appears to be  Moderately incurvated with hyperkeratosis formation at the distal aspects of  the lateral nail border. (+) Erythema. (+) Edema. (-) serosanguous  drainage present. The remaining nails appear unremarkable at this time. There are no open sores, lesions or other signs of infection present.  Vascular: Dorsalis Pedis artery and Posterior Tibial artery pedal pulses are 2/4 bilateral with immedate capillary fill time. Pedal hair growth present. No lower extremity edema.   Neruologic: Grossly intact via light touch bilateral. Unchanged neuritis on left after hip surgery.  Musculoskeletal: Tenderness to palpation of the left hallux lateral nail fold. Muscular strength within normal limits in all groups bilateral except on left where there is weakness after hip surgery.   Assesement and Plan: Problem List Items Addressed This Visit    None    Visit Diagnoses    Ingrown nail    -  Primary   Toe pain, left          -  Discussed treatment alternatives and plan of care; Explained permanent/temporary nail avulsion and post procedure course to patient. - After a verbal consent, injected 3 ml of a 50:50 mixture of 2% plain  lidocaine and 0.5% plain marcaine in a normal hallux block fashion. Next, a  betadine prep was performed. Anesthesia was tested and found to be appropriate.  The offending left hallux lateral nail border was then incised from the hyponychium to the epinychium. The offending nail border was removed and cleared from the field. The area was curretted for any remaining nail or spicules. Phenol application  performed and the area was then flushed with alcohol and dressed with antibiotic cream and a dry sterile dressing. -Patient was instructed to leave the dressing intact for today and begin soaking  in a weak solution of betadine or Epsom salt and water tomorrow. Patient was instructed to  soak for 15 minutes each day and apply neosporin and a gauze or bandaid dressing each day. -Patient was instructed to monitor the toe for signs of infection and return to office if toe becomes red, hot or swollen. -Advised ice, elevation, and tylenol or motrin if needed for pain.  -Patient is to return in 2 weeks for follow up care/nail check or sooner if problems arise.  [name], DPM

## 2017-04-07 NOTE — Patient Instructions (Signed)

## 2017-04-14 ENCOUNTER — Encounter: Payer: Self-pay | Admitting: Genetics

## 2017-04-14 ENCOUNTER — Telehealth: Payer: Self-pay | Admitting: Genetics

## 2017-04-14 ENCOUNTER — Ambulatory Visit: Payer: Self-pay | Admitting: Genetics

## 2017-04-14 DIAGNOSIS — Z800 Family history of malignant neoplasm of digestive organs: Secondary | ICD-10-CM

## 2017-04-14 DIAGNOSIS — Z1509 Genetic susceptibility to other malignant neoplasm: Secondary | ICD-10-CM | POA: Insufficient documentation

## 2017-04-14 DIAGNOSIS — C541 Malignant neoplasm of endometrium: Secondary | ICD-10-CM

## 2017-04-14 DIAGNOSIS — Z1379 Encounter for other screening for genetic and chromosomal anomalies: Secondary | ICD-10-CM

## 2017-04-14 DIAGNOSIS — Z803 Family history of malignant neoplasm of breast: Secondary | ICD-10-CM

## 2017-04-14 HISTORY — DX: Encounter for other screening for genetic and chromosomal anomalies: Z13.79

## 2017-04-14 NOTE — Telephone Encounter (Signed)
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  She also has the familial BRCA2 VUS.   I reviewed her risks and recommended she have colonoscopy every 1-2 years.  She indicated that she would her results to be sent to her ObGYN Dr. Marin Roberts.  She has seen a GI specialist, but does not know what that doctor's is.  We recommended that she have a Gastroenterologist who can follow her for this daignosis of Lynch syndrome. She said she will let us know when she contacts this office and let us know who she would like [place] to send her results to.  In the meantime she would like Dr. Marin Roberts to have these results.  I also informed her we can make recommendations for gastroenterologists if she needs them.  She declined a follow-up appointment as we discussed a positive result in dept at her initial appointment.  We recommended her sons get genetic testing now as they are both the age we would start doing colonoscopies.  Other relatives (siblings, cousins, aunts/uncles,e tc) are also at risk to have this mutation and should get testing.  The laboratory offers free genetic testing to all relatives for 90 days if they are interested.

## 2017-04-14 NOTE — Progress Notes (Signed)
GENETIC TEST RESULTS   Patient Name: Bailey Gomez Patient Age: 56 y.o. Encounter Date: 04/14/2017  Referring Provider: Self-referred/sister   HPI: Bailey Gomez was previously seen in the [place] clinic due to a family of cancer and concerns regarding a hereditary predisposition to cancer. Please refer to our prior cancer genetics clinic note for more information regarding Bailey Gomez's medical, social and family histories, and our assessment and recommendations, at the time. Bailey Gomez recent genetic test results were disclosed to her, as were recommendations warranted by these results. These results and recommendations are discussed in more detail below.   FAMILY HISTORY:  We obtained a detailed, 4-generation family history.  Significant diagnoses are listed below: Family History  Problem Relation Age of Onset  . Aneurysm Mother        died at 57  . Heart attack Father        died at 75  . Breast cancer Sister 39       brain mets    Bailey Gomez has 2 sons ages 86 and 1 with no history of cancer.  Bailey Gomez has 4 sisters listed below: -1 sister was diagnosed with triple negative breast cancer at 63.  This sister had genetic testing that revealed a MSH6 [id number] (p.Ile944Metfs*4) pathogenic mutation.  This sister has an 16 year-old son.  -1 sister is 37 with no history of cancer.  She has 2 daughters (ages 45 and 36).  -1 sister is 65 with no history of cancer.  She has a 77 year-old daughter and a 44 year-old son.   -1 sister is 65 with no history of cancer.  She had genetic testing for the familial MSH6 mutation and was negative.    Bailey Gomez father died at 23 due to a heart attack.  Bailey Gomez has 2 paternal aunts who are still living with no history of cancer and a paternal uncle who died in his 76's due to an accident.  Bailey Gomez has several paternal cousins with no history of cancer.  Bailey Gomez paternal grandparents are deceased but no further  information is known about them.   Bailey Gomez mother died from an aneurysm at 61.  She had no history of cancer.  Bailey Gomez has 3 maternal aunts and 5 maternal uncles listed below: -2 maternal aunts are still living with no history of cancer.   -1 maternal aunt is deceased with no history of cancer.  -2 maternal uncles died at ages older than 17 and had no history of cancer.  -1 maternal uncle died at 76 due to lung cancer.  -2 maternal uncles are still living with no history of cancer.  Bailey Gomez has several maternal cousins, none that have had any history of cancer.  Bailey Gomez maternal grandfather died at 79 with no history of cancer.  Bailey Gomez maternal grandmother died at 100 due to an unknown type of cancer (possibly stomach cancer).  She possibly had stomach cancer.   Bailey Gomez is aware of her sister's genetic testing and positive result of a MSH6 mutation. of previous family history of genetic testing for hereditary cancer risks. Patient's maternal ancestors are of Native American/Black descent, and paternal ancestors are of Black descent. There is no reported Ashkenazi Jewish ancestry. There is no known consanguinity.  GENETIC TESTING:  At the time of Bailey Gomez's visit, we recommended she pursue genetic testing of the [facility]. This genetic testing offered by [facility]  identified a single,  heterozygous pathogenic gene mutation called [id number] (p.Ile944Metfs*4, confirming the diagnosis of Lynch syndrome.  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 c.6125A>C (p.Gln2042Pro)  A copy of the test report has been scanned into Epic for review.     Regarding the VUS in BRCA2: At this time, it is unknown if this variant is associated with increased cancer risk or if this is a normal finding, but most variants such as this get reclassified to being inconsequential. It should not be used to make medical management decisions.  With time, we suspect the lab will determine the significance of this variant, if any. If we do learn more about it, we will try to contact Bailey Gomez to discuss it further. However, it is important to stay in touch with [place] periodically and keep the address and phone number up to date.   CLINICAL CONDITION Lynch syndrome is characterized by an increased risk of colorectal cancer as well as cancers of the endometrium, ovary, prostate, stomach, small intestine, hepatobiliary tract, urinary tract, pancreas and brain (PMID: 7616073, 71062694, 85462703). Lynch syndrome tumors typically demonstrate microsatellite instability (MSI) as well as loss of expression of the mismatch repair proteins on immunohistochemical (IHC) staining. This condition is caused by pathogenic variants in one of the mismatch repair genes-MLH1, MSH2, MSH6, PMS2-as well as deletions in the 3' end of the EPCAM gene.  RISKS   MANAGEMENT:Surveillance guidelines, as published by the [employer]  (NCCN), suggest the following for individuals with Lynch syndrome (*NCCN. Genetic/Familial High-Risk Assessment: Colorectal. Version 2.2016):  Colon cancer:  - Colonoscopy every 1-2 years starting at 62-25 years of age, or 2-5 years prior to the earliest colon cancer if diagnosed before age 25.   Gynecological cancer:  Uterine: Education regarding prompt evaluation for dysfunctional uterine bleeding.  Prophylactic hysterectomy can be considered.  Bailey Gomez has already had uterine cancer and her uterus was removed.  She does report her ovaries are still intact.   Ovarian: Insufficient evidence exists to make any specific recommendations for Prophylactic bilateral salpingo-oophorectomy (BSO) for patients with mutations in MSH6 and PMS2.  This may be considered by women after childbearing is complete. Serum CA-125 for ovarian cancer and transvaginal ultrasound for ovarian and endometrial cancer  may be considered at the clinician's discretion. These modalities have not been shown to be sufficiently sensitive or specific to support a positive recommendation.  Other extra-colonic cancers - While there is no clear evidence to support screening for gastric, duodenal, and small bowel cancer, select individuals, families, or those of Asian descent may consider esophagogastroduodenoscopy (EGD) with extended duodenoscopy every 3-5 years beginning at age 20-19 years of age. - Consider testing for and treating H. pylori. - Consider annual urinalysis beginning at 63-75 years of age to screen for urothelial cancer. - Consider annual physical/neurologic examination starting at 10-83 years of age.  Current data suggests that individuals with Lynch Syndrome mutations may be at an increased risk for prostate and breast cancer.  However at this time, insufficient evidence exists to recommend increased screening above the average population recommendations.   NCCN states that no screening recommendation can be made for pancreatic cancerat this time (*NCCN. Genetic/Familial High-Risk Assessment: Colorectal. Version 2.2016). In contrast, the [employer] of Gastroenterology Clinical Guidelines recommend pancreatic cancer screening for individuals with Lynch syndrome with a first- or second-degree relative affected with pancreatic cancer (PMID: 50093818, 29937169). Ideally, screening should be performed in experienced centers utilizing a multidisciplinary approach under research conditions. Recommended  screening includes annual endoscopic ultrasound and/or MRI of the pancreas starting at age 65 or 18 years younger than the earliest age of pancreatic cancer diagnosis in the family (PMID: 33354562).     FAMILY MEMBERS:  Lynch Syndrome has Autosomal Dominant Inheritance.  Since we now know the mutation in Bailey Gomez, we can test at-risk relatives to  determine whether or not they have inherited the mutation and are at increased risk for cancer.  It is important that all of Ms. Orozco's relatives (both men and women) know of the presence of this gene mutation. Site-specific genetic testing can sort out who in the family is at risk and who is not. We will be happy to meet with any of the family members or refer them to a genetic counselor in their local area. To locate genetic counselors in other cities, individuals can visit the website of the [employer] (ArtistMovie.se) and Secretary/administrator for a Social worker by zip code.   Ms. Pompa children and siblings have a 50% chance to have inherited this mutation. We recommend they have genetic testing for this same mutation, as identifying the presence of this mutation would allow them to also take advantage of risk-reducing measures.  Her sons are 79 and 52, and we would recommend starting colonoscopies for them now if they are positive for the MSH6 mutation.   Relatives in the family can get free single site testing for the familial MSH6 mutation through the laboratory [facility] for 90 days.  After this window, relatives insurance will still likely cover the genetic testing.    Children who inherit two mutations in the same Lynch gene, one mutation from each parent, are at-risk of a rare recessive condition called constitutional mismatch repair deficiency (CMMR-D) syndrome. If family members have this mutation, they may wish to have their partner tested if they are planning on having children.  PLAN: Ms. Difranco will need to be followed as high risk based on her diagnosis of Lynch syndrome.     Ms. Phariss has identified a gynecologist that she plans to see for follow up for her diagnosis of Lynch syndrome.  This individual is Dr. Marin Roberts.    We recommended she also see a gastroenterologist on a regular basis to follow her for this indication.  She informed us that she has a gastroenterologist  that she has seen for her last colonoscopy that she will reach out to and return to.  She does not know the name of the doctor however as it was while ago.  I recommended that we send recommendations and letters directly to a GI specialist and gave her the option to also be seen in the high risk clinic at [facility].   She decided that she would like to go back to her GI specialist in [place] and will reach out to them to set up an appointment.  She will contact us to let us know when she sets up this appointment so we can send her genetics records to this provider.    RESOURCES:  Ms. Malanga was given patient information about Lynch syndrome that was written [employer] specifically for individuals who are positive for a mutation in the MSH6 gene and information written by the national support group "it takes guts".    We strongly encouraged Bailey Gomez to remain in contact with [place] in cancer genetics on an annual basis so we can update Ms. Langille's personal and family histories, and inform her of advances  in cancer genetics that may be of benefit for the entire family. Ms. Hovatter knows she is also welcome to call with any questions or concerns, at any time.   Tana Felts, [place] Genetic Counselor  [phone]

## 2017-04-15 ENCOUNTER — Encounter: Payer: Self-pay | Admitting: Genetics

## 2017-04-18 ENCOUNTER — Encounter: Payer: Self-pay | Admitting: Genetics

## 2017-04-21 ENCOUNTER — Ambulatory Visit (INDEPENDENT_AMBULATORY_CARE_PROVIDER_SITE_OTHER): Payer: 59 | Admitting: Sports Medicine

## 2017-04-21 DIAGNOSIS — M79675 Pain in left toe(s): Secondary | ICD-10-CM

## 2017-04-21 DIAGNOSIS — Z9889 Other specified postprocedural states: Secondary | ICD-10-CM

## 2017-04-21 DIAGNOSIS — L600 Ingrowing nail: Secondary | ICD-10-CM

## 2017-04-21 NOTE — Patient Instructions (Signed)

## 2017-04-21 NOTE — Progress Notes (Signed)
Subjective: Bailey Gomez is a 56 y.o. female patient returns to office today for follow up evaluation after having Left Hallux lateral permanent nail avulsion performed on 04-07-17. Patient has been soaking using epsom salt and applying topical antibiotic covered with bandaid daily. Patient denies fever/chills/nausea/vomitting/any other related constitutional symptoms at this time.  Patient Active Problem List   Diagnosis Date Noted  . Genetic testing 04/14/2017  . MSH6-related Lynch syndrome (HNPCC5)   . Family history of Lynch syndrome 03/23/2017  . Family history of breast cancer 03/23/2017  . 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 Menopausal symptoms 04/12/2013  . Endometrial cancer ([place]) 02/17/2012    Current Outpatient Prescriptions on File Prior to Visit  Medication Sig Dispense Refill  . amLODipine (NORVASC) 10 MG tablet Take 10 mg by mouth daily.  3  . aspirin 81 MG EC tablet     . celecoxib (CELEBREX) 200 MG capsule     . Dermatological Products, Misc. (NUVAIL) SOLN APPLY AT BEDTIME TO NAIL  1  . diclofenac (VOLTAREN) 75 MG EC tablet Take 1 tablet (75 mg total) by mouth 2 (two) times daily. 30 tablet 2  . docusate sodium (COLACE) 100 MG capsule     . gabapentin (NEURONTIN) 100 MG capsule Take 1 pill at bedtime. Increase as tolerated to 3 pills.    Marland Kitchen LYRICA 75 MG capsule     . megestrol (MEGACE) 40 MG tablet TAKE 1 TABLET BY MOUTH EVERY DAY 30 tablet 12  . pantoprazole (PROTONIX) 40 MG tablet     . simvastatin (ZOCOR) 5 MG tablet Take 5 mg by mouth at bedtime.     Current Facility-Administered Medications on File Prior to Visit  Medication Dose Route Frequency Provider Last Rate Last Dose  . triamcinolone acetonide (KENALOG) 10 MG/ML injection 10 mg  10 mg Other Once [name], DPM      . triamcinolone acetonide (KENALOG) 10 MG/ML injection 10 mg  10 mg Other Once [name], DPM      . triamcinolone acetonide (KENALOG) 10 MG/ML injection 10 mg  10 mg Other  Once [place], Keanu Lesniak, DPM      . triamcinolone acetonide (KENALOG-40) injection 20 mg  20 mg Other Once [place], Maclain Cohron, DPM      . triamcinolone acetonide (KENALOG-40) injection 20 mg  20 mg Other Once [name], DPM        No Known Allergies  Objective:  General: Well developed, nourished, in no acute distress, alert and oriented x3   Dermatology: Skin is warm, dry and supple bilateral. Left hallux lateral nail bed appears to be clean, dry, with mild granular tissue and surrounding eschar/scab. (-) Erythema. (-) Edema. (-) serosanguous drainage present. The remaining nails appear unremarkable at this time. There are no other lesions or other signs of infection present.   Neurovascular status: Unchanged from prior. No lower extremity swelling; No pain with calf compression bilateral.  Musculoskeletal: Decreased tenderness to palpation of the Left hallux nail lateral fold. Muscular strength unchanged from prior.   Assesement and Plan: Problem List Items Addressed This Visit    None    Visit Diagnoses    S/P nail surgery    -  Primary   Ingrown nail       Toe pain, left          -Examined patient  -Cleansed left hallux lateral nail fold and gently scrubbed with peroxide and q-tip/curetted away eschar at site and applied antibiotic cream covered with bandaid.  -  Discussed plan of care with patient. -Patient to continue soaking in a weak solution of Epsom salt and warm water. Patient was instructed to soak for 15-20 minutes each day until the toe appears normal and there is no drainage, redness, tenderness, or swelling at the procedure site, and apply neosporin and a gauze or bandaid dressing each day as needed. May leave open to air at night. -Educated patient on long term care after nail surgery. -Patient was instructed to monitor the toe for reoccurrence and signs of infection; Patient advised to return to office or go to ER if toe becomes red, hot or swollen. -Patient is to  return as needed or sooner if problems arise.  [name], DPM

## 2017-04-27 ENCOUNTER — Encounter: Payer: Self-pay | Admitting: Neurology

## 2017-04-27 ENCOUNTER — Ambulatory Visit (INDEPENDENT_AMBULATORY_CARE_PROVIDER_SITE_OTHER): Payer: 59 | Admitting: Neurology

## 2017-04-27 VITALS — BP 124/80 | HR 76 | Ht 62.0 in | Wt 144.2 lb

## 2017-04-27 DIAGNOSIS — G5702 Lesion of sciatic nerve, left lower limb: Secondary | ICD-10-CM | POA: Diagnosis not present

## 2017-04-27 DIAGNOSIS — M21372 Foot drop, left foot: Secondary | ICD-10-CM

## 2017-04-27 MED ORDER — AMBULATORY NON FORMULARY MEDICATION: 1.0000 [IU] | Freq: Every day | 0 refills | Status: AC

## 2017-04-27 NOTE — Patient Instructions (Signed)
Referral for left foot ankle foot orthotics  Return to clinic in 6 months

## 2017-04-27 NOTE — Progress Notes (Signed)
Follow-up Visit   Date: 04/27/17    Bailey Gomez MRN: [id number] DOB: 1960-11-03   Interim History: Bailey Gomez is a 56 y.o. African American female with hypertension, GERD, and hyperlipidemia returning to the clinic for follow-up of left sciatic neuropathy manifesting with foot numbness.  The patient was accompanied to the clinic by self.  History of present illness: She had left hip surgery on 04/29/2016 and immediately following this, she developed numbness of the left great toe and over the following days, the numbness involved her entire sole.  Around the same time, she also started noticing weakness of the foot, especially difficulty flexing and extending the toes and foot.  She did physical therapy for two months for her left hip as well as foot drop which did not provide any benefit.  She also complains of burning, throbbing, achy, and stabbing pain of the feet, worse with weight bearing and needs to take rest breaks often.  She has been tried on gabapentin and Lyrica which did not provide any relief., but does not recall the dose.  She is followed by Dr. Cannon Kettle who is managing her with NSAIDs for arthritis of the foot.   She has been thoroughly evaluated by Amy Alexia Freestone, PA at [facility] neuroscience in February 2018 for these same complaints.  Her previous evaluation includes MRI of the lumbar spine showing disc degeneration at L4-5 without neural impingement. 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 She also had MRI of the pelvis and ultrasound of the sciatic nerve,which confirmed nerve injury.  She is here for a second opinion. She is frustrated that she cannot walk without a limp, has pain in the feet, and has persistent weakness of the foot.  Fortunately, she has not suffered any falls.   UPDATE 04/27/2017:  She has constant throbbing numbness of the  left foot, as if she is walking with a brick on her foot.  Her tingling is less, but she has burning is still present. She completed physical therapy for about 6 weeks but did not noticed much benefit.  She is able to stand and walk for about three hours, before she needs to rest it.  She is understandably frustrated at her recovery and has many questions.  Medications:  Current Outpatient Prescriptions on File Prior to Visit  Medication Sig Dispense Refill  . amLODipine (NORVASC) 10 MG tablet Take 10 mg by mouth daily.  3  . aspirin 81 MG EC tablet     . diclofenac (VOLTAREN) 75 MG EC tablet Take 1 tablet (75 mg total) by mouth 2 (two) times daily. 30 tablet 2   Current Facility-Administered Medications on File Prior to Visit  Medication Dose Route Frequency Provider Last Rate Last Dose  . triamcinolone acetonide (KENALOG) 10 MG/ML injection 10 mg  10 mg Other Once [name], DPM      . triamcinolone acetonide (KENALOG) 10 MG/ML injection 10 mg  10 mg Other Once [name], DPM      . triamcinolone acetonide (KENALOG) 10 MG/ML injection 10 mg  10 mg Other Once [place], Titory[place], DPM      . triamcinolone acetonide (KENALOG-40) injection 20 mg  20 mg Other Once [place], [place]itorya, DPM      . triamcinolone acetonide (KENALOG-40) injection 20 mg  20 mg Other Once [name], DPM        Allergies: No  Known Allergies  Review of Systems:  CONSTITUTIONAL: No fevers, chills, night sweats, or weight loss.  EYES: No visual changes or eye pain ENT: No hearing changes.  No history of nose bleeds.   RESPIRATORY: No cough, wheezing and shortness of breath.   CARDIOVASCULAR: Negative for chest pain, and palpitations.   GI: Negative for abdominal discomfort, blood in stools or black stools.  No recent change in bowel habits.   GU:  No history of incontinence.   MUSCLOSKELETAL: No history of joint pain or swelling.  No myalgias.   SKIN: Negative for lesions, rash, and itching.   ENDOCRINE:  Negative for cold or heat intolerance, polydipsia or goiter.   PSYCH:  No depression or anxiety symptoms.   NEURO: As Above.   Vital Signs:  BP 124/80   Pulse 76   Ht 5\' 2"  (1.575 m)   Wt 144 lb 4 oz (65.4 kg)   SpO2 99%   BMI 26.38 kg/m  Pain Scale: 0 on a scale of 0-10   General: Well-appearing, comfortable  Neurological Exam: MENTAL STATUS including orientation to time, place, person, recent and remote memory, attention span and concentration, language, and fund of knowledge is normal.  Speech is not dysarthric.  CRANIAL NERVES:  Pupils equal round and reactive to light.  Normal conjugate, extra-ocular eye movements in all directions of gaze.  No ptosis.  Face is symmetric. Palate elevates symmetrically.  Tongue is midline.  MOTOR: Upper extremities are 5/5 bilaterally.  There is mild left TA atrophy.  No abnormal movements.  No pronator drift.  Tone is normal.    Right Lower Extremity:    Left Lower Extremity:    Hip flexors  5/5   Hip flexors  5/5   Hip extensors  5/5   Hip extensors  5/5   Knee flexors  5/5   Knee flexors  5/5   Knee extensors  5/5   Knee extensors  5/5   Eversion 5/5  Eversion 0/5  Inversion 5/5  Inversion 0/5  Dorsiflexors  5/5   Dorsiflexors  2+/5   Plantarflexors  5/5   Plantarflexors  2/5   Toe extensors  5/5   Toe extensors  3-/5   Toe flexors  5/5   Toe flexors  3-/5   Tone (Ashworth scale)  0  Tone (Ashworth scale)  0   MSRs:  Reflexes are brisk 2+/4 throughout, except absent left Achilles.  SENSORY:  Diminished vibration at the left ankle and great toe, temperature is intact.  COORDINATION/GAIT:  Gait narrow based, very minimal dragging of the left foot. Unassisted and stable.  She is able to stand on toes (improved), unable to stand on heels on the left.  Data: Lab Results  Component Value Date   TSH 1.59 12/17/2016   Lab Results  Component Value Date   EUMPNTIR44 315 12/17/2016   Lab Results  Component Value Date   FOLATE 17.9  12/17/2016    Nerve ultrasound 09/07/2016:   Ultrasound of the left sciatic nerve showed enlargement decreased echogenicity when compared to the right. There was no axonal discontinuity or compressive structures. The right sciatic nerve had normal appearance.  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 No right hip fracture or dislocation.  EMG report dated 08/12/2016 performed at [facility]:  There is electrophysiologic evidence of an incomplete left sciatic neuropathy at or above the hip. There is also reinnervation  seen in the gluteus medius which may be due to focal muscle damage or damaged superior gluteal nerve during surgery. The patient had trouble understanding her to activate the gluteus maximus but no denervation was seen.   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  It has been 1 year following her injury and she continues to have marked diffuse weakness and sensory of the left foot, suggesting that she will have some degree of lasting deficits.  As compared to her last visit, she is able to dorsiflex the ankle and extend/flex the toes better (3-/5), however inversion and eversion is poor.  Gait surprisingly does not show flail foot and she is able to stand on toes which is improved.  Recommend that she start to use a left foot AFO to stabilize the foot.  Continue home exercises.  She had many questions which were answered to the best of my ability.  Return to clinic in 6 months  The duration of this appointment visit was 30 minutes of face-to-face time with the patient.  Greater than 50% of this time was spent in counseling, explanation of diagnosis, planning of further management, and coordination of care.   Thank you for allowing me to participate in patient's care.   If I can answer any additional questions, I would be pleased to do so.    Sincerely,    Ginnifer Creelman K. Posey Pronto, DO

## 2017-05-12 ENCOUNTER — Ambulatory Visit: Payer: 59 | Admitting: Sports Medicine

## 2017-05-12 DIAGNOSIS — M898X9 Other specified disorders of bone, unspecified site: Secondary | ICD-10-CM

## 2017-05-12 DIAGNOSIS — M79671 Pain in right foot: Secondary | ICD-10-CM

## 2017-05-12 DIAGNOSIS — M7751 Other enthesopathy of right foot: Secondary | ICD-10-CM

## 2017-05-12 DIAGNOSIS — M778 Other enthesopathies, not elsewhere classified: Secondary | ICD-10-CM

## 2017-05-12 DIAGNOSIS — M779 Enthesopathy, unspecified: Principal | ICD-10-CM

## 2017-05-12 NOTE — Progress Notes (Signed)
Patient ID: Bailey Gomez, female   DOB: 1960/10/20, 56 y.o.   MRN: [id number]   Subjective: Bailey Gomez is a 56 y.o. female patient who returns to office for evaluation of RIGHT foot pain. Patient states that she is having pain on top of right foot. Patient requests injections. Patient denies any other acute changes or problems.   Patient Active Problem List   Diagnosis Date Noted  . Genetic testing 04/14/2017  . MSH6-related Lynch syndrome (HNPCC5)   . Family history of Lynch syndrome 03/23/2017  . Family history of breast cancer 03/23/2017  . 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 Menopausal symptoms 04/12/2013  . Endometrial cancer ([place]) 02/17/2012    Current Outpatient Prescriptions on File Prior to Visit  Medication Sig Dispense Refill  . AMBULATORY NON FORMULARY MEDICATION 1 Units by Other route daily. Left AFO 1 Units 0  . amLODipine (NORVASC) 10 MG tablet Take 10 mg by mouth daily.  3  . aspirin 81 MG EC tablet     . diclofenac (VOLTAREN) 75 MG EC tablet Take 1 tablet (75 mg total) by mouth 2 (two) times daily. 30 tablet 2   Current Facility-Administered Medications on File Prior to Visit  Medication Dose Route Frequency Provider Last Rate Last Dose  . triamcinolone acetonide (KENALOG) 10 MG/ML injection 10 mg  10 mg Other Once [name], DPM      . triamcinolone acetonide (KENALOG) 10 MG/ML injection 10 mg  10 mg Other Once [name], DPM      . triamcinolone acetonide (KENALOG) 10 MG/ML injection 10 mg  10 mg Other Once [place], Ceonna Frazzini, DPM      . triamcinolone acetonide (KENALOG-40) injection 20 mg  20 mg Other Once [place], Kristjan Derner, DPM      . triamcinolone acetonide (KENALOG-40) injection 20 mg  20 mg Other Once [name], DPM        No Known Allergies  Objective:  General: Alert and oriented x3 in no acute distress  Dermatology:Old surgical scars well healed, No open lesions bilateral lower extremities, no webspace macerations, no  ecchymosis bilateral, all nails x 10 are well manicured.  Vascular: Dorsalis Pedis and Posterior Tibial pedal pulses palpable, Capillary Fill Time 3 seconds, (+) pedal hair growth bilateral, no edema bilateral lower extremities, Temperature gradient within normal limits.  Neurology: Gross sensation intact via light touch on right. 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 (- )Tinels sign bilateral.   Musculoskeletal: Moderate tenderness at dorsal lateral midfoot on right, same as previous,No pain with calf compression bilateral. Midtarsal and ankle joint range of motion limited bilateral, Subtalar joint range of motion is within normal limits, there is no 1st ray hypermobility noted bilateral, mild decreased 1st MPJ rom Right>Left with functional limitus noted on weightbearing exam. L drop foot. Strength within normal limits in all groups bilateral except left.   Assessment and Plan: Problem List Items Addressed This Visit    None    Visit Diagnoses    Capsulitis of right foot    -  Primary   Bony exostosis       Right foot pain         -Complete examination performed -Previous Xrays reviewed -Re-Discussed treatement options for capsulitis and possible overuse tendonitis with arthritis  -Continue with Diclofenac -After oral consent and aseptic prep, injected a mixture containing 1 ml of 2% plain lidocaine, 1 ml 0.5% plain marcaine, 0.5 ml of kenalog 10 and 0.5 ml of dexamethasone phosphate  into right lateral midfoot  without complication. Post-injection care discussed with patient.  -Recommend Epsom soaks, ice, and elevation as needed -Recommend continue with compression stocking for periods of long standing and walking -Recommend continue with good supportive shoes and orthotics.  - Continue with neurology recommendations; Patient is awaiting brace for left drop foot -Patient to return to office as needed or sooner if problems or issues arise or condition  worsens.  [name], DPM

## 2017-06-06 ENCOUNTER — Telehealth: Payer: Self-pay | Admitting: Neurology

## 2017-06-06 NOTE — Telephone Encounter (Signed)
Last OV noted faxed to [facility].

## 2017-06-06 NOTE — Telephone Encounter (Signed)
Patient called due to having went to [facility] to get a brace that Dr.Patel had recommended. They told her that her Insurance requires the Physicians last office note to be sent. Thanks

## 2017-06-27 DIAGNOSIS — M21372 Foot drop, left foot: Secondary | ICD-10-CM | POA: Diagnosis not present

## 2017-07-14 DIAGNOSIS — I10 Essential (primary) hypertension: Secondary | ICD-10-CM | POA: Diagnosis not present

## 2017-07-14 DIAGNOSIS — E559 Vitamin D deficiency, unspecified: Secondary | ICD-10-CM | POA: Diagnosis not present

## 2017-07-14 DIAGNOSIS — M5416 Radiculopathy, lumbar region: Secondary | ICD-10-CM | POA: Diagnosis not present

## 2017-07-14 DIAGNOSIS — M1990 Unspecified osteoarthritis, unspecified site: Secondary | ICD-10-CM | POA: Diagnosis not present

## 2017-07-14 DIAGNOSIS — E782 Mixed hyperlipidemia: Secondary | ICD-10-CM | POA: Diagnosis not present

## 2017-07-20 ENCOUNTER — Ambulatory Visit: Payer: 59 | Admitting: Sports Medicine

## 2017-07-20 ENCOUNTER — Telehealth: Payer: Self-pay | Admitting: *Deleted

## 2017-07-20 ENCOUNTER — Encounter: Payer: Self-pay | Admitting: Sports Medicine

## 2017-07-20 DIAGNOSIS — M79671 Pain in right foot: Secondary | ICD-10-CM

## 2017-07-20 DIAGNOSIS — M7751 Other enthesopathy of right foot: Secondary | ICD-10-CM

## 2017-07-20 DIAGNOSIS — M21372 Foot drop, left foot: Secondary | ICD-10-CM

## 2017-07-20 DIAGNOSIS — M778 Other enthesopathies, not elsewhere classified: Secondary | ICD-10-CM

## 2017-07-20 DIAGNOSIS — M898X9 Other specified disorders of bone, unspecified site: Secondary | ICD-10-CM

## 2017-07-20 DIAGNOSIS — M779 Enthesopathy, unspecified: Secondary | ICD-10-CM

## 2017-07-20 DIAGNOSIS — M792 Neuralgia and neuritis, unspecified: Secondary | ICD-10-CM

## 2017-07-20 NOTE — Telephone Encounter (Signed)
Orders to Gretta Arab, RN

## 2017-07-20 NOTE — Progress Notes (Signed)
Patient ID: Bailey Gomez, female   DOB: Nov 23, 1960, 57 y.o.   MRN: [id number]   Subjective: Bailey Gomez is a 57 y.o. female patient who returns to office for evaluation of RIGHT foot pain. Patient states that she is having pain on top of right foot and now new pain at the inside of the ankle. Patient requests injection.  Reports that she saw the neurologist recently and they have started her with using a AFO brace on the left which seems like it is helping however she feels like over the last 2 weeks since she has been wearing the brace it has aggravated her right foot more.  Patient denies any other acute changes or problems.   Patient Active Problem List   Diagnosis Date Noted  . Genetic testing 04/14/2017  . MSH6-related Lynch syndrome (HNPCC5)   . Family history of Lynch syndrome 03/23/2017  . Family history of breast cancer 03/23/2017  . 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 Menopausal symptoms 04/12/2013  . Endometrial cancer ([place]) 02/17/2012    Current Outpatient Medications on File Prior to Visit  Medication Sig Dispense Refill  . AMBULATORY NON FORMULARY MEDICATION 1 Units by Other route daily. Left AFO 1 Units 0  . amLODipine (NORVASC) 10 MG tablet Take 10 mg by mouth daily.  3  . aspirin 81 MG EC tablet     . diclofenac (VOLTAREN) 75 MG EC tablet Take 1 tablet (75 mg total) by mouth 2 (two) times daily. 30 tablet 2   Current Facility-Administered Medications on File Prior to Visit  Medication Dose Route Frequency Provider Last Rate Last Dose  . triamcinolone acetonide (KENALOG) 10 MG/ML injection 10 mg  10 mg Other Once [name], DPM      . triamcinolone acetonide (KENALOG) 10 MG/ML injection 10 mg  10 mg Other Once [name], DPM      . triamcinolone acetonide (KENALOG) 10 MG/ML injection 10 mg  10 mg Other Once [name], [name], DPM      . triamcinolone acetonide (KENALOG-40) injection 20 mg  20 mg Other Once [name], [name], DPM      . triamcinolone  acetonide (KENALOG-40) injection 20 mg  20 mg Other Once [name], DPM        No Known Allergies  Objective:  General: Alert and oriented x3 in no acute distress  Dermatology:Old surgical scars well healed, No open lesions bilateral lower extremities, no webspace macerations, no ecchymosis bilateral, all nails x 10 are well manicured.  Vascular: Dorsalis Pedis and Posterior Tibial pedal pulses palpable, Capillary Fill Time 3 seconds, (+) pedal hair growth bilateral, no edema bilateral lower extremities, Temperature gradient within normal limits.  Neurology: Gross sensation intact via light touch on right. 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 (- )Tinels sign bilateral.   Musculoskeletal: Moderate tenderness at dorsal lateral midfoot on right, same as previous and new pain along medial ankle at posterior tibial tendon on the right,No pain with calf compression bilateral. Midtarsal and ankle joint range of motion limited bilateral, Subtalar joint range of motion is within normal limits, there is no 1st ray hypermobility noted bilateral, mild decreased 1st MPJ rom Right>Left with functional limitus noted on weightbearing exam. L drop foot. Strength within normal limits in all groups bilateral except left.   Assessment and Plan: Problem List Items Addressed This Visit    None    Visit Diagnoses    Capsulitis of right foot    -  Primary  Bony exostosis       Tendinitis       Right foot pain       Neuritis       Foot drop, left         -Complete examination performed -Previous Xrays reviewed -Re-Discussed treatement options for capsulitis and possible overuse tendonitis with arthritis  -Continue with Diclofenac -After oral consent and aseptic prep, injected a mixture containing 1 ml of 2% plain lidocaine, 1 ml 0.5% plain marcaine, 0.5 ml of kenalog 10 and 0.5 ml of dexamethasone phosphate into right lateral midfoot and along posterior tibial tendon  without complication. Post-injection care discussed with patient.  -Ordered MRI for further evaluation -Recommend Epsom soaks, ice, and elevation as needed -Recommend continue with compression stocking for periods of long standing and walking -Recommend continue with good supportive shoes and AFO on left -Continue with neurology follow-up -Patient to return to office for MRI results or sooner if problems or issues arise or condition worsens.  [name], DPM

## 2017-07-20 NOTE — Telephone Encounter (Signed)
-----   Message from [place], [place] sent at 07/20/2017  9:39 AM EST ----- Regarding: MRI R Foot and Ankle Pain at dorsal midfoot and at medial ankle on right >1 year. Pain at medial ankle is new last 2 weeks

## 2017-07-22 NOTE — Telephone Encounter (Signed)
[facility] scheduled pt for MRI of right ankle 07/26/2017 arrive 4:15pm for a 4:30pm MRI with right foot to follow. I informed pt of [place] imaging appt. Faxed orders to [facility] imaging.

## 2017-07-26 DIAGNOSIS — M65871 Other synovitis and tenosynovitis, right ankle and foot: Secondary | ICD-10-CM | POA: Diagnosis not present

## 2017-07-26 DIAGNOSIS — M19071 Primary osteoarthritis, right ankle and foot: Secondary | ICD-10-CM | POA: Diagnosis not present

## 2017-08-05 ENCOUNTER — Telehealth: Payer: Self-pay | Admitting: *Deleted

## 2017-08-05 ENCOUNTER — Telehealth: Payer: Self-pay | Admitting: Neurology

## 2017-08-05 NOTE — Telephone Encounter (Signed)
I called patient back and she said that she is still having pain and tightness in her foot, cramps in the back of her leg and her foot is also turning inward.  She is still using the AFO but it is not helping much.  She was wondering if she may need EMG or sooner appointment.  Instructed patient to continue using the AFO and I let her know that I will talk to Dr. Posey Pronto on Tuesday and call her back with further instructions.

## 2017-08-05 NOTE — Telephone Encounter (Signed)
Patient wants to talk to someone about what is going on with her foot

## 2017-08-05 NOTE — Telephone Encounter (Signed)
Pt states she had MRI and has not heard the results.

## 2017-08-05 NOTE — Telephone Encounter (Signed)
Left message informing pt, I had faxed request to [place] for the results of the MRI to be faxed to our office since they are not in Epic system. Faxed request to [facility].

## 2017-08-08 ENCOUNTER — Encounter: Payer: Self-pay | Admitting: Sports Medicine

## 2017-08-09 ENCOUNTER — Telehealth: Payer: Self-pay | Admitting: Sports Medicine

## 2017-08-09 ENCOUNTER — Telehealth: Payer: Self-pay | Admitting: *Deleted

## 2017-08-09 NOTE — Telephone Encounter (Signed)
Please advise 

## 2017-08-09 NOTE — Telephone Encounter (Signed)
Called and spoke with patient relayed Dr Leeanne Rio message for the MRI findings patient stated understanding and will continue with Dr Leeanne Rio orders and follow up as scheduled.

## 2017-08-09 NOTE — Telephone Encounter (Signed)
-----   Message from [name], [place] sent at 08/09/2017  7:16 AM EST ----- Mel Can you let patient know that her MRI showed arthritis in her foot and ankle and some inflammation of her tendons likely due to overuse from the arthritis or how she's been walking when she's in pain. There's no immediate need for [place] to do anything about these changes. Arthritis and tendonitis responds best to anti-inflammatories and rest, ice, elevation, and good supportive shoes/insoles. If she has more questions or concerns we can schedule her for a follow up appointment in the next few weeks. Thanks Dr. Cannon Kettle

## 2017-08-09 NOTE — Telephone Encounter (Signed)
Dr Cannon Kettle please review MRI results scanned yesterday and advise.

## 2017-08-09 NOTE — Telephone Encounter (Signed)
I'm calling in regards to my MRI. I would like to know what the results are. You can reach me at [phone]. Thank you.

## 2017-08-09 NOTE — Telephone Encounter (Signed)
I would like to evaluate her in the clinic before deciding the next step, she is scheduled on 3/22 and we can add her to wait list, if she needs to be sooner.    Donika K. Posey Pronto, DO

## 2017-08-10 DIAGNOSIS — Z96642 Presence of left artificial hip joint: Secondary | ICD-10-CM | POA: Diagnosis not present

## 2017-08-10 DIAGNOSIS — M25552 Pain in left hip: Secondary | ICD-10-CM | POA: Diagnosis not present

## 2017-08-10 DIAGNOSIS — S79912A Unspecified injury of left hip, initial encounter: Secondary | ICD-10-CM | POA: Diagnosis not present

## 2017-08-10 NOTE — Telephone Encounter (Signed)
Patient notified that I will put her on our waiting list.

## 2017-09-19 ENCOUNTER — Ambulatory Visit (INDEPENDENT_AMBULATORY_CARE_PROVIDER_SITE_OTHER): Payer: 59 | Admitting: Neurology

## 2017-09-19 ENCOUNTER — Encounter: Payer: Self-pay | Admitting: Neurology

## 2017-09-19 VITALS — BP 100/68 | HR 96 | Ht 62.0 in | Wt 146.1 lb

## 2017-09-19 DIAGNOSIS — M21372 Foot drop, left foot: Secondary | ICD-10-CM

## 2017-09-19 DIAGNOSIS — R292 Abnormal reflex: Secondary | ICD-10-CM | POA: Diagnosis not present

## 2017-09-19 MED ORDER — CYCLOBENZAPRINE HCL 5 MG PO TABS: 5.0000 mg | ORAL_TABLET | Freq: Three times a day (TID) | ORAL | 5 refills | Status: AC | PRN

## 2017-09-19 NOTE — Patient Instructions (Signed)
MRI lumbar spine without contrast  Start flexeril 5mg  at bedtime for muscle cramps

## 2017-09-19 NOTE — Progress Notes (Signed)
Follow-up Visit   Date: 09/19/17    Bailey Gomez MRN: [id number] DOB: Aug 23, 1960   Interim History: Bailey Gomez is a 57 y.o. African American female with hypertension, GERD, and hyperlipidemia returning to the clinic for follow-up of left sciatic neuropathy manifesting with foot numbness.  The patient was accompanied to the clinic by self.  History of present illness: She had left hip surgery on 04/29/2016 and immediately following this, she developed numbness of the left great toe and over the following days, the numbness involved her entire sole.  Around the same time, she also started noticing weakness of the foot, especially difficulty flexing and extending the toes and foot.  She did physical therapy for two months for her left hip as well as foot drop which did not provide any benefit.  She also complains of burning, throbbing, achy, and stabbing pain of the feet, worse with weight bearing and needs to take rest breaks often.  She has been tried on gabapentin and Lyrica which did not provide any relief., but does not recall the dose.  She is followed by Dr. Cannon Kettle who is managing her with NSAIDs for arthritis of the foot.   She has been thoroughly evaluated by Amy Alexia Freestone, PA at [facility] neuroscience in February 2018 for these same complaints.  Her previous evaluation includes MRI of the lumbar spine showing disc degeneration at L4-5 without neural impingement. 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 She also had MRI of the pelvis and ultrasound of the sciatic nerve, which confirmed nerve injury.  She is here for a second opinion. She is frustrated that she cannot walk without a limp, has pain in the feet, and has persistent weakness of the foot.  Fortunately, she has not suffered any falls.   UPDATE 04/27/2017:  She has constant throbbing numbness of the  left foot, as if she is walking with a brick on her foot.  Her tingling is less, but she has burning is still present. She completed physical therapy for about 6 weeks but did not noticed much benefit.  She is able to stand and walk for about three hours, before she needs to rest it.  She is understandably frustrated at her recovery and has many questions.  UPDATE 09/19/2017:  She is here for follow-up visit.  Her left foot has started to cramp more frequently and often keeps her awake at night.  She is still very frustrated at the lack of sensation in the left leg as well as foot weakness. She has not noticed any change in her foot movements.  She purchased a AFO online which she prefers better than the one [employer] gave. She does not have any low back pain.    Medications:  Current Outpatient Medications on File Prior to Visit  Medication Sig Dispense Refill  . AMBULATORY NON FORMULARY MEDICATION 1 Units by Other route daily. Left AFO 1 Units 0  . amLODipine (NORVASC) 10 MG tablet Take 10 mg by mouth daily.  3  . aspirin 81 MG EC tablet     . naproxen sodium (ALEVE) 220 MG tablet Take 220 mg by mouth.     Current Facility-Administered Medications on File Prior to Visit  Medication Dose Route Frequency Provider Last Rate Last Dose  . triamcinolone acetonide (KENALOG) 10 MG/ML injection 10 mg  10 mg Other Once [name], DPM      .  triamcinolone acetonide (KENALOG) 10 MG/ML injection 10 mg  10 mg Other Once [name], DPM      . triamcinolone acetonide (KENALOG) 10 MG/ML injection 10 mg  10 mg Other Once [place], [place], DPM      . triamcinolone acetonide (KENALOG-40) injection 20 mg  20 mg Other Once [place], [place], DPM      . triamcinolone acetonide (KENALOG-40) injection 20 mg  20 mg Other Once [name], DPM        Allergies: No Known Allergies  Review of Systems:  CONSTITUTIONAL: No fevers, chills, night sweats, or weight loss.  EYES: No visual changes or eye pain ENT:  No hearing changes.  No history of nose bleeds.   RESPIRATORY: No cough, wheezing and shortness of breath.   CARDIOVASCULAR: Negative for chest pain, and palpitations.   GI: Negative for abdominal discomfort, blood in stools or black stools.  No recent change in bowel habits.   GU:  No history of incontinence.   MUSCLOSKELETAL: No history of joint pain or swelling.  No myalgias.   SKIN: Negative for lesions, rash, and itching.   ENDOCRINE: Negative for cold or heat intolerance, polydipsia or goiter.   PSYCH:  No depression or anxiety symptoms.   NEURO: As Above.   Vital Signs:  BP 100/68   Pulse 96   Ht 5\' 2"  (1.575 m)   Wt 146 lb 2 oz (66.3 kg)   SpO2 97%   BMI 26.73 kg/m  Pain Scale: 0 on a scale of 0-10   General: Well-appearing, comfortable  Neurological Exam: MENTAL STATUS including orientation to time, place, person, recent and remote memory, attention span and concentration, language, and fund of knowledge is normal.  Speech is not dysarthric.  CRANIAL NERVES:  Pupils equal round and reactive to light.  Normal conjugate, extra-ocular eye movements in all directions of gaze.  No ptosis.  Face is symmetric.   MOTOR: Upper extremities are 5/5 bilaterally.  There is mild left TA atrophy.  No abnormal movements.  No pronator drift.  Tone is normal.    Right Lower Extremity:    Left Lower Extremity:    Hip flexors  5/5   Hip flexors  5/5   Hip extensors  5/5   Hip extensors  5/5   Knee flexors  5/5   Knee flexors  5/5   Knee extensors  5/5   Knee extensors  5/5   Eversion 5/5  Eversion 0/5  Inversion 5/5  Inversion 0/5  Dorsiflexors  5/5   Dorsiflexors  1+/5   Plantarflexors  5/5   Plantarflexors  1/5   Toe extensors  5/5   Toe extensors  2+/5   Toe flexors  5/5   Toe flexors  2+/5   Tone (Ashworth scale)  0  Tone (Ashworth scale)  0   MSRs:  Right                                                                 Left brachioradialis 2+  brachioradialis 2+  biceps 2+   biceps 2+  triceps 2+  triceps 2+  patellar 3+  patellar 3+  ankle jerk 2+  ankle jerk 0  Hoffman no  Hoffman no  plantar response down  plantar response down  Crossed adductors are present.   SENSORY:  Diminished vibration at the left ankle and great toe, temperature is intact.  COORDINATION/GAIT:  Gait narrow based, mild dragging of the left foot. Unassisted and stable.    Data: Lab Results  Component Value Date   TSH 1.59 12/17/2016   Lab Results  Component Value Date   KYHCWCBJ62 831 12/17/2016   Lab Results  Component Value Date   FOLATE 17.9 12/17/2016    Nerve ultrasound 09/07/2016:   Ultrasound of the left sciatic nerve showed enlargement decreased echogenicity when compared to the right. There was no axonal discontinuity or compressive structures. The right sciatic nerve had normal appearance.  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 No right hip fracture or dislocation.  EMG report dated 08/12/2016 performed at [place]:  There is electrophysiologic evidence of an incomplete left sciatic neuropathy at or above the hip. There is also reinnervation seen in the gluteus medius which may be due to focal muscle damage or damaged superior gluteal nerve during surgery. The patient had trouble understanding her to activate the gluteus maximus but no denervation was seen.   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  Her motor strength in the left foot has regressed from her last visit and she also has brisk patella jerks today with crossed adductors, concerning for a secondary process such as lumbar canal stenosis.  I will order MRI lumbar spine wo contrast to assess.  For her cramps, start flexeril 5mg  at bedtime.  She  is frustrated at the lack of improvement with respect to her foot sensation and I discussed that given no improvement, this is most likely permanent deficits.    Further recommendations will be based on her imaging results.  Greater than 50% of this 25 minute visit was spent in counseling, explanation of diagnosis, planning of further management, and coordination of care.    Thank you for allowing me to participate in patient's care.  If I can answer any additional questions, I would be pleased to do so.    Sincerely,    Lashondra Vaquerano K. Posey Pronto, DO

## 2017-09-28 ENCOUNTER — Ambulatory Visit
Admission: RE | Admit: 2017-09-28 | Discharge: 2017-09-28 | Disposition: A | Discharge status: Home / Self Care | Payer: 59 | Source: Ambulatory Visit | Attending: Neurology | Admitting: Neurology

## 2017-09-28 DIAGNOSIS — M21372 Foot drop, left foot: Secondary | ICD-10-CM

## 2017-09-28 DIAGNOSIS — M48061 Spinal stenosis, lumbar region without neurogenic claudication: Secondary | ICD-10-CM | POA: Diagnosis not present

## 2017-09-28 DIAGNOSIS — R292 Abnormal reflex: Secondary | ICD-10-CM

## 2017-09-30 ENCOUNTER — Telehealth: Payer: Self-pay | Admitting: *Deleted

## 2017-09-30 ENCOUNTER — Ambulatory Visit: Payer: 59 | Admitting: Neurology

## 2017-09-30 NOTE — Telephone Encounter (Signed)
Patient given results and instructions.   

## 2017-09-30 NOTE — Telephone Encounter (Signed)
-----   Message from Alda Berthold, DO sent at 09/30/2017  9:47 AM EDT ----- Please inform patient that there is no areas of nerve impingement in the back, there is mild age-related changes of the disc which can cause low back pain.  Recommend taking ibuprofen or tylenol for low back pain.

## 2017-10-06 ENCOUNTER — Telehealth: Payer: Self-pay | Admitting: *Deleted

## 2017-10-06 ENCOUNTER — Ambulatory Visit: Payer: 59 | Admitting: Sports Medicine

## 2017-10-06 ENCOUNTER — Encounter: Payer: Self-pay | Admitting: Sports Medicine

## 2017-10-06 DIAGNOSIS — M79671 Pain in right foot: Secondary | ICD-10-CM

## 2017-10-06 DIAGNOSIS — M21372 Foot drop, left foot: Secondary | ICD-10-CM

## 2017-10-06 DIAGNOSIS — M792 Neuralgia and neuritis, unspecified: Secondary | ICD-10-CM

## 2017-10-06 DIAGNOSIS — M779 Enthesopathy, unspecified: Secondary | ICD-10-CM | POA: Diagnosis not present

## 2017-10-06 MED ORDER — TRIAMCINOLONE ACETONIDE 40 MG/ML IJ SUSP: 20.0000 mg | Freq: Once | INTRAMUSCULAR | Status: AC

## 2017-10-06 NOTE — Progress Notes (Signed)
Patient ID: Bailey Gomez, female   DOB: [birth year], 57 y.o.   MRN: [id number]   Subjective: Bailey Gomez is a 57 y.o. female patient who returns to office for evaluation of RIGHT foot pain. Patient states that she is having pain at the inside of the ankle. Patient requests injection.  Reports that she saw the neurologist recently and that she is unhappy with the current care and wants a referral to another neurologist.  Patient is very adamant and thinks that they should be repeating a nerve study.  Patient states that she is frustrated by the fact that nothing more can be done for her nerve changes in the weakness that she has developed on the left.  Patient is adamant about wanting to know if there is anything more that she can do.  Patient states that she had a AFO that she ended up having to replace and got a new brace that has a hinged that helps her better when she is walking.  Patient also wants me to look at her left great toenail.  Patient denies any other acute changes or problems.   Patient Active Problem List   Diagnosis Date Noted  . Genetic testing 04/14/2017  . MSH6-related Lynch syndrome (HNPCC5)   . Family history of Lynch syndrome 03/23/2017  . Family history of breast cancer 03/23/2017  . 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 Menopausal symptoms 04/12/2013  . Endometrial cancer ([place]) 02/17/2012    Current Outpatient Medications on File Prior to Visit  Medication Sig Dispense Refill  . AMBULATORY NON FORMULARY MEDICATION 1 Units by Other route daily. Left AFO 1 Units 0  . amLODipine (NORVASC) 10 MG tablet Take 10 mg by mouth daily.  3  . aspirin 81 MG EC tablet     . cyclobenzaprine (FLEXERIL) 5 MG tablet Take 1 tablet (5 mg total) by mouth 3 (three) times daily as needed for muscle spasms. 30 tablet 5  . naproxen sodium (ALEVE) 220 MG tablet Take 220 mg by mouth.     Current Facility-Administered Medications on File Prior to Visit  Medication Dose Route Frequency  Provider Last Rate Last Dose  . triamcinolone acetonide (KENALOG) 10 MG/ML injection 10 mg  10 mg Other Once [name], DPM      . triamcinolone acetonide (KENALOG) 10 MG/ML injection 10 mg  10 mg Other Once [name], DPM      . triamcinolone acetonide (KENALOG) 10 MG/ML injection 10 mg  10 mg Other Once [place], Adelin Ventrella, DPM      . triamcinolone acetonide (KENALOG-40) injection 20 mg  20 mg Other Once [place], Jennye Runquist, DPM      . triamcinolone acetonide (KENALOG-40) injection 20 mg  20 mg Other Once [name], DPM        No Known Allergies  Objective:  General: Alert and oriented x3 in no acute distress  Dermatology:Old surgical scars well healed, No open lesions bilateral lower extremities, no webspace macerations, no ecchymosis bilateral, all nails x 10 are well manicured.  There is mild dry skin along the lateral nail margin with no signs of symptoms of acute ingrowing or recurrence.  Vascular: Dorsalis Pedis and Posterior Tibial pedal pulses palpable, Capillary Fill Time 3 seconds, (+) pedal hair growth bilateral, no edema bilateral lower extremities, Temperature gradient within normal limits.  Neurology: Gross sensation intact via light touch on right. 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 (- )Tinels sign bilateral.   Musculoskeletal: Mild tenderness along  medial ankle at posterior tibial tendon on the right with a subjective burning sensation along the PT nerve area.  There is no pain with palpation to the dorsal right midfoot,No pain with calf compression bilateral. Midtarsal and ankle joint range of motion limited bilateral, Subtalar joint range of motion is within normal limits, there is no 1st ray hypermobility noted bilateral, mild decreased 1st MPJ rom Right>Left with functional limitus noted on weightbearing exam. L drop foot. Strength within normal limits in all groups bilateral except left.   Assessment and Plan: Problem List  Items Addressed This Visit    None    Visit Diagnoses    Tendinitis    -  Primary   Relevant Medications   triamcinolone acetonide (KENALOG-40) injection 20 mg (Start on 10/06/2017  1:00 PM)   Foot drop, left       Right foot pain       Relevant Medications   triamcinolone acetonide (KENALOG-40) injection 20 mg (Start on 10/06/2017  1:00 PM)   Neuritis         -Complete examination performed -Previous neurology notes and MRI reviewed -Re-Discussed treatement options for overuse tendonitis with arthritis  -Continue with Diclofenac as needed -After oral consent and aseptic prep, injected a mixture containing 1 ml of 2% plain lidocaine, 1 ml 0.5% plain marcaine, 0.5 ml of kenalog 40 and 0.5 ml of dexamethasone phosphate into right foot along posterior tibial tendon without complication. Post-injection care discussed with patient.  -Advised patient to gently file left great toenail and to use tea tree oil along the dry skin margins -Recommend continue with compression stocking for periods of long standing and walking -Recommend continue with good supportive shoes and dynamic AFO on left -Continue with neurology follow-up; new referral made on patient's behalf for a second opinion on her neurologic status -Patient to return to office as needed or sooner if problems or issues arise or condition worsens.  [name], DPM

## 2017-10-06 NOTE — Telephone Encounter (Signed)
-----   Message from [name], [place] sent at 10/06/2017  9:24 AM EDT ----- Regarding: Neurology second opinion  Patient wants to see a different neurologist.  She was seeing Dr. Frutoso Schatz neurology and prefers now to try to see a neurologist at [facility] neurology please send with referral recent chart note and I will have Melody to fax her latest note from Dr. Posey Pronto which you can send with her referral information. Thanks Dr. Cannon Kettle

## 2017-10-06 NOTE — Telephone Encounter (Signed)
Faxed required form, clinicals and demographics to [facility].

## 2017-10-06 NOTE — Telephone Encounter (Signed)
Faxed addendum of 07/23/2017 foot MRIs and 09/28/2017 MRI [facility] to [facility] with required form.

## 2017-10-21 ENCOUNTER — Telehealth: Payer: Self-pay | Admitting: *Deleted

## 2017-10-21 MED ORDER — DICLOFENAC SODIUM 75 MG PO TBEC: 75.0000 mg | DELAYED_RELEASE_TABLET | Freq: Two times a day (BID) | ORAL | 0 refills | Status: AC

## 2017-10-21 NOTE — Telephone Encounter (Signed)
Refill request diclofenac. Dr. Cannon Kettle states refill once, pt needs an appt prior to future refills.

## 2017-11-04 ENCOUNTER — Ambulatory Visit: Payer: 59 | Admitting: Neurology

## 2017-11-15 DIAGNOSIS — M1990 Unspecified osteoarthritis, unspecified site: Secondary | ICD-10-CM | POA: Diagnosis not present

## 2017-11-15 DIAGNOSIS — E782 Mixed hyperlipidemia: Secondary | ICD-10-CM | POA: Diagnosis not present

## 2017-11-15 DIAGNOSIS — E559 Vitamin D deficiency, unspecified: Secondary | ICD-10-CM | POA: Diagnosis not present

## 2017-11-15 DIAGNOSIS — I10 Essential (primary) hypertension: Secondary | ICD-10-CM | POA: Diagnosis not present

## 2017-12-06 DIAGNOSIS — Z1231 Encounter for screening mammogram for malignant neoplasm of breast: Secondary | ICD-10-CM | POA: Diagnosis not present

## 2017-12-14 ENCOUNTER — Encounter: Payer: Self-pay | Admitting: Neurology

## 2017-12-14 ENCOUNTER — Other Ambulatory Visit: Payer: Self-pay

## 2017-12-14 ENCOUNTER — Ambulatory Visit: Payer: 59 | Admitting: Neurology

## 2017-12-14 VITALS — BP 135/95 | HR 80 | Resp 20 | Ht 62.0 in | Wt 144.5 lb

## 2017-12-14 DIAGNOSIS — M21372 Foot drop, left foot: Secondary | ICD-10-CM | POA: Diagnosis not present

## 2017-12-14 DIAGNOSIS — G5702 Lesion of sciatic nerve, left lower limb: Secondary | ICD-10-CM | POA: Diagnosis not present

## 2017-12-14 DIAGNOSIS — R292 Abnormal reflex: Secondary | ICD-10-CM

## 2017-12-14 DIAGNOSIS — R269 Unspecified abnormalities of gait and mobility: Secondary | ICD-10-CM | POA: Diagnosis not present

## 2017-12-14 MED ORDER — IMIPRAMINE HCL 25 MG PO TABS: 25.0000 mg | ORAL_TABLET | Freq: Every day | ORAL | 3 refills | Status: DC

## 2017-12-14 MED ORDER — CARISOPRODOL 350 MG PO TABS: 350.0000 mg | ORAL_TABLET | Freq: Every day | ORAL | 5 refills | Status: AC

## 2017-12-14 NOTE — Progress Notes (Signed)
[facility]  PATIENT: Bailey Gomez DOB: 03/04/1961  REFERRING DOCTOR OR PCP:  Dr. Cannon Kettle, DPM, Delorise Shiner (PCP) SOURCE: patient, notes from Dr. Cannon Kettle, imaging reports, MRI images on PACS.  _________________________________   HISTORICAL  CHIEF COMPLAINT:  Chief Complaint  Patient presents with  . Left Foot Pain    Batina is here for eval of pain/numbness entire left foot onset after total left hip replacement in 2017.  Wears a left AFO.  Sts. she is here for a 3rd opinion--wants to know if sx. are permanent.  Sts. she has seen [facility] for same and was told numbness and pain are from permanent nerve damage. Sees podiatry and gets injections in left foot. Sts. no relief with Gabapentin, Flexeril, Aleve./fim  . Left Foot Numbness    HISTORY OF PRESENT ILLNESS:  I had the pleasure of seeing your patient, Bailey Gomez, at [facility] for neurologic consultation regarding her left foot pain.     She is a 57 year old woman who reports pain and numbness began in the entire left foot after a total left hip replacement in 2017.   The same day as her surgery, she first reports having numbness in the left great toe.   Over a week, she felt the numbness involved more of the foot to involve the entire bottom of her foot.   She feels it is like she had a shot of Novocaine.   The distribution is the entire sole and the heel.    She noted the weakness immediately after her surgery as well.   She noted more weakness at the toes and ankles while doing PT when she started to move around more.   The hip flexion and leg extension are not weak.    She also notes cramps in her calf.    The cramps have become very painful.  More recently, she saw Dr. Narda Amber who ordered an AFO brace.   She got a brace online which is helping her some.      Currently, besides the numbness she has pain that worsens when she is on her foot more and has continued weakness  that only slightly improved from its nadir shortly after surgery.    The throbbing pain has not been helped by Aleve, gabapentin and flexeril.    She gets cramps a few times a day not helped by Flexeril.    Pain is worse at night due to more cramps and she has trouble staying asleep.      Although she felt she had some improvement in strength the first year she heas not noted further improvement the past year.   The spasms and dysesthesia have changed some in character but the pain is about the same.    The spasticity at night seems to be worse over the past 6 to 12 months.  She denies LBP.    Bladder function is fine.     She first saw her Teodora Medici, [place] at [facility] neuroscience in February 2018.  She had an MRI of the lumbar spine that showed degenerative changes at L4-L5.  Of note, she has a transitional S1 segment.   Repeat MRI 09/28/2017 showed some progression of the L4-L5 degenerative changes with adjacent edema in the endplates.  She also has facet hypertrophy.  I personally reviewed both MRIs.  There did not appear to be nerve root compression.    She also had a nerve conduction/EMG which showed an incomplete left  sciatic neuropathy near the hip as there was re-innervation in the gluteus medius muscle.   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       REVIEW OF SYSTEMS: Constitutional: No fevers, chills, sweats, or change in appetite Eyes: No visual changes, double vision, eye pain Ear, nose and throat: No hearing loss, ear pain, nasal congestion, sore throat Cardiovascular: No chest pain, palpitations Respiratory: No shortness of breath at rest or with exertion.   No wheezes GastrointestinaI: No nausea, vomiting, diarrhea, abdominal pain, fecal incontinence Genitourinary: No dysuria, urinary retention or frequency.  No nocturia. Musculoskeletal: No neck pain, back pain Integumentary: No rash, pruritus, skin  lesions Neurological: as above Psychiatric: No depression at this time.  No anxiety Endocrine: No palpitations, diaphoresis, change in appetite, change in weigh or increased thirst Hematologic/Lymphatic: No anemia, purpura, petechiae. Allergic/Immunologic: No itchy/runny eyes, nasal congestion, recent allergic reactions, rashes  ALLERGIES: No Known Allergies  HOME MEDICATIONS:  Current Outpatient Medications:  .  AMBULATORY NON FORMULARY MEDICATION, 1 Units by Other route daily. Left AFO, Disp: 1 Units, Rfl: 0 .  amLODipine (NORVASC) 10 MG tablet, Take 10 mg by mouth daily., Disp: , Rfl: 3 .  aspirin 81 MG EC tablet, , Disp: , Rfl:  .  cyclobenzaprine (FLEXERIL) 5 MG tablet, Take 1 tablet (5 mg total) by mouth 3 (three) times daily as needed for muscle spasms., Disp: 30 tablet, Rfl: 5 .  diclofenac (VOLTAREN) 75 MG EC tablet, Take 1 tablet (75 mg total) by mouth 2 (two) times daily., Disp: 30 tablet, Rfl: 0 .  Multiple Vitamin (MULTIVITAMIN) tablet, Take 1 tablet by mouth daily., Disp: , Rfl:  .  carisoprodol (SOMA) 350 MG tablet, Take 1 tablet (350 mg total) by mouth at bedtime., Disp: 30 tablet, Rfl: 5 .  imipramine (TOFRANIL) 25 MG tablet, Take 1 tablet (25 mg total) by mouth at bedtime., Disp: 30 tablet, Rfl: 3 .  simvastatin (ZOCOR) 10 MG tablet, Take 10 mg by mouth at bedtime., Disp: , Rfl: 3  Current Facility-Administered Medications:  .  triamcinolone acetonide (KENALOG) 10 MG/ML injection 10 mg, 10 mg, Other, Once, [place], [place], DPM .  triamcinolone acetonide (KENALOG) 10 MG/ML injection 10 mg, 10 mg, Other, Once, [place], [place], DPM .  triamcinolone acetonide (KENALOG) 10 MG/ML injection 10 mg, 10 mg, Other, Once, [place], [place], DPM .  triamcinolone acetonide (KENALOG-40) injection 20 mg, 20 mg, Other, Once, [place], [place], DPM .  triamcinolone acetonide (KENALOG-40) injection 20 mg, 20 mg, Other, Once, [place], [place], DPM .  triamcinolone acetonide (KENALOG-40)  injection 20 mg, 20 mg, Other, Once, [name], DPM  PAST MEDICAL HISTORY: Past Medical History:  Diagnosis Date  . Complex endometrial hyperplasia with atypia   . 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 Family history of breast cancer 03/23/2017  . Family history of Lynch syndrome   . Genetic testing 04/14/2017   The patient had genetic testing due to a personal history of uterine cancer and a family history of Lynch Syndrome (MSH6).  The Common Hereditary Cancer Panel was ordered.  The Hereditary Gene Panel offered by [facility] includes sequencing and/or deletion duplication testing of the following 46 genes: APC, ATM, AXIN2, BARD1, BMPR1A, BRCA1, BRCA2, BRIP1, CDH1, CDKN2A (p14ARF), CDKN2A (p16INK4a), CHEK  . MSH6-related Lynch syndrome (HNPCC5)     PAST SURGICAL HISTORY: Past Surgical History:  Procedure Laterality Date  . ABDOMINAL HYSTERECTOMY  12/23/2010   TAH  . BUNIONECTOMY Bilateral   . HYSTEROSCOPY W/D&C  10/2010  . TOTAL HIP ARTHROPLASTY Left 04/29/2016    FAMILY HISTORY: Family History  Problem Relation Age of Onset  . Aneurysm Mother        died at 99  . Heart attack Father        died at 62  . Breast cancer Sister 44       brain mets    SOCIAL HISTORY:  Social History   Socioeconomic History  . Marital status: Married    Spouse name: Not on file  . Number of children: 2  . Years of education: 23  . Highest education level: Not on file  Occupational History  . Occupation: retired  Scientific laboratory technician  . Financial resource strain: Not on file  . Food insecurity:    Worry: Not on file    Inability: Not on file  . Transportation needs:    Medical: Not on file    Non-medical: Not on file  Tobacco Use  . Smoking status: Never Smoker  . Smokeless tobacco: Never Used  Substance and Sexual Activity  . Alcohol use: No    Comment: occassional  . Drug use: No  . Sexual activity: Yes  Lifestyle  . Physical activity:     Days per week: Not on file    Minutes per session: Not on file  . Stress: Not on file  Relationships  . Social connections:    Talks on phone: Not on file    Gets together: Not on file    Attends religious service: Not on file    Active member of club or organization: Not on file    Attends meetings of clubs or organizations: Not on file    Relationship status: Not on file  . Intimate partner violence:    Fear of current or ex partner: Not on file    Emotionally abused: Not on file    Physically abused: Not on file    Forced sexual activity: Not on file  Other Topics Concern  . Not on file  Social History Narrative   Lives with husband in a one story home.  Has 2 sons.  Retired from [employer].  Education: high school.  +     PHYSICAL EXAM  Vitals:   12/14/17 1025  BP: (!) 135/95  Pulse: 80  Resp: 20  Weight: 144 lb 8 oz (65.5 kg)  Height: 5' 2"  (1.575 m)    Body mass index is 26.43 kg/m.   General: The patient is well-developed and well-nourished and in no acute distress  Eyes:  Funduscopic exam shows normal optic discs and retinal vessels.  Neck: The neck is supple, no carotid bruits are noted.  The neck is nontender.  Cardiovascular: The heart has a regular rate and rhythm with a normal S1 and S2. There were no murmurs, gallops or rubs. Lungs are clear to auscultation.  Skin: Extremities are without significant edema.  Musculoskeletal:  Back is nontender  Neurologic Exam  Mental status: The patient is alert and oriented x 3 at the time of the examination. The patient has apparent normal recent and remote memory, with an apparently normal attention span and concentration ability.   Speech is normal.  Cranial nerves: Extraocular movements are full. Pupils are equal, round, and reactive to light and accomodation.  Visual fields are full.  Facial symmetry is present. There is good facial sensation to soft touch bilaterally.Facial  strength is normal.   Trapezius and sternocleidomastoid strength is normal. No dysarthria is noted.  The tongue is midline, and the patient has symmetric elevation of the soft palate. No obvious hearing deficits are noted.  Motor: The circumference of the lower leg on the left is 2 cm less than the circumference on the right measured 10 cm below the knee.   Muscle tone was normal.  Strength was normal in the arms and the right leg.  Hip and knee flexors and extensors have normal strength in the left.  Strength was 2- to 2/5 in the lower leg with ankle and toe extension and flexion.  Sensory: She reported mild reduced vibration and touch sensation in the left ankle and plantar foot compared to the right.  Coordination: Cerebellar testing reveals good finger-nose-finger and heel-to-shin bilaterally.  Gait and station: Station is normal.   The gait has a slightly reduced stride with a foot drop on the left.  She does not need to use a cane.. Tandem gait is reduced . Romberg is negative.   Reflexes: Deep tendon reflexes are symmetric and normal in the arms,  but she increased reflexes at the knees with spread.  She has no ankle reflex on the left..   Plantar responses are flexor.    DIAGNOSTIC DATA (LABS, IMAGING, TESTING) - I reviewed patient records, labs, notes, testing and imaging myself where available.  Lab Results  Component Value Date   WBC 12.7 (H) 12/23/2010   HGB 12.2 12/23/2010   HCT 37.6 12/23/2010   MCV 91.3 12/23/2010   PLT 237 12/23/2010      Component Value Date/Time   NA 133 (L) 12/23/2010 0447   K 4.2 12/23/2010 0447   CL 100 12/23/2010 0447   CO2 25 12/23/2010 0447   GLUCOSE 185 (H) 12/23/2010 0447   BUN 9 12/23/2010 0447   CREATININE 0.47 12/23/2010 0447   CALCIUM 8.9 12/23/2010 0447   GFRNONAA >60 12/23/2010 0447   GFRAA >60 12/23/2010 0447   No results found for: CHOL, HDL, LDLCALC, LDLDIRECT, TRIG, CHOLHDL No results found for: HGBA1C Lab Results  Component Value Date    JASNKNLZ76 734 12/17/2016   Lab Results  Component Value Date   TSH 1.59 12/17/2016       ASSESSMENT AND PLAN  Neuropathy of left sciatic nerve  Abnormal reflex - Plan: MR CERVICAL SPINE WO CONTRAST, MR THORACIC SPINE WO CONTRAST  Left foot drop  Gait disturbance   In summary, Bailey Gomez is a 57 year old woman with a left sciatic neuropathy who also has hyperreflexia at the knee.  The sciatic neuropathy symptoms began in association with hip surgery on the left.  Initially she showed some improvement but has not shown further improvement over the past 6 to 12 months.  Therefore, she may not get further improvement at this point.  The sciatic neuropathy could not explain her increased reflexes at the knees.  To determine if there is a second process going on that may also affect her gait I am going to check an MRI of the cervical and thoracic spine to rule out myelopathy.   To help with the dysesthetic nerve pain, imipramine will be started.  Additionally Soma at bedtime only will be added for the spasms.  If the central nervous system problem such as myelopathy is found on MRI, consider changing the Soma to baclofen.  She will return to see me in 3 to 4 months or sooner if there are new or worsening  neurologic symptoms.  She should call us back sooner if she does not note any benefit from the medications.  Thank you for asking me to see [name].  Please let me know if I can be of further assistance with her or other patients in the future.  Richard A. Felecia Shelling, MD, [facility] 03/18/5882, 2:54 PM Certified in Neurology, Clinical Neurophysiology, Sleep Medicine, Pain Medicine and Neuroimaging  [facility] [address] [place], [place] [address] [phone]

## 2017-12-15 ENCOUNTER — Telehealth: Payer: Self-pay | Admitting: Neurology

## 2017-12-15 ENCOUNTER — Telehealth: Payer: Self-pay | Admitting: *Deleted

## 2017-12-15 ENCOUNTER — Encounter: Payer: Self-pay | Admitting: Sports Medicine

## 2017-12-15 ENCOUNTER — Ambulatory Visit: Payer: 59 | Admitting: Sports Medicine

## 2017-12-15 DIAGNOSIS — M778 Other enthesopathies, not elsewhere classified: Secondary | ICD-10-CM

## 2017-12-15 DIAGNOSIS — M898X9 Other specified disorders of bone, unspecified site: Secondary | ICD-10-CM | POA: Diagnosis not present

## 2017-12-15 DIAGNOSIS — M21372 Foot drop, left foot: Secondary | ICD-10-CM

## 2017-12-15 DIAGNOSIS — M79671 Pain in right foot: Secondary | ICD-10-CM

## 2017-12-15 DIAGNOSIS — M792 Neuralgia and neuritis, unspecified: Secondary | ICD-10-CM

## 2017-12-15 DIAGNOSIS — M779 Enthesopathy, unspecified: Secondary | ICD-10-CM

## 2017-12-15 DIAGNOSIS — M19079 Primary osteoarthritis, unspecified ankle and foot: Secondary | ICD-10-CM

## 2017-12-15 MED ORDER — CELECOXIB 200 MG PO CAPS: 200.0000 mg | ORAL_CAPSULE | Freq: Two times a day (BID) | ORAL | 2 refills | Status: DC

## 2017-12-15 NOTE — Telephone Encounter (Signed)
MR Cervical spine wo contrast & MR Thoracic spine wo contarst Dr. Felecia Shelling [employer] Auth: [place] via [employer] website. Patient is scheduled at [facility] for 12/20/17.

## 2017-12-15 NOTE — Telephone Encounter (Signed)
-----   Message from [name], [place] sent at 12/15/2017  9:51 AM EDT ----- Regarding: Rheumatology consult Arthritis of foot Multiple painful joints that are swelling Back issues Please have rheumatologist evaluate her Thanks Dr. Chauncey Cruel

## 2017-12-15 NOTE — Telephone Encounter (Signed)
Faxed referral, clinicals and demographics to [facility].

## 2017-12-15 NOTE — Progress Notes (Signed)
Patient ID: Bailey Gomez, female   DOB: 1961/05/20, 57 y.o.   MRN: [id number]   Subjective: Bailey Gomez is a 57 y.o. female patient who returns to office for evaluation of RIGHT foot pain. Patient states that she is having pain again at the top of her right foot.  Admits pain is sharp 6 out of 10 and is noticing swelling on both feet but the right is slightly worse.  Patient also admits swelling in her fingers and multiple joints and issue with her back having extensive arthritis just like her foot.  Patient states that she is following closely with her neurologist and is compliant with using her brace on the left and compression garments been taking diclofenac which helps however is wondering if there is something stronger that she can have.  Patient denies any other acute changes or problems.   Patient Active Problem List   Diagnosis Date Noted  . Left foot drop 12/14/2017  . Gait disturbance 12/14/2017  . Abnormal reflex 12/14/2017  . Genetic testing 04/14/2017  . MSH6-related Lynch syndrome (HNPCC5)   . Family history of Lynch syndrome 03/23/2017  . Family history of breast cancer 03/23/2017  . 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 Menopausal symptoms 04/12/2013  . Endometrial cancer ([place]) 02/17/2012    Current Outpatient Medications on File Prior to Visit  Medication Sig Dispense Refill  . AMBULATORY NON FORMULARY MEDICATION 1 Units by Other route daily. Left AFO 1 Units 0  . amLODipine (NORVASC) 10 MG tablet Take 10 mg by mouth daily.  3  . aspirin 81 MG EC tablet     . carisoprodol (SOMA) 350 MG tablet Take 1 tablet (350 mg total) by mouth at bedtime. 30 tablet 5  . cyclobenzaprine (FLEXERIL) 5 MG tablet Take 1 tablet (5 mg total) by mouth 3 (three) times daily as needed for muscle spasms. 30 tablet 5  . diclofenac (VOLTAREN) 75 MG EC tablet Take 1 tablet (75 mg total) by mouth 2 (two) times daily. 30 tablet 0  . imipramine (TOFRANIL) 25 MG tablet Take 1 tablet (25 mg  total) by mouth at bedtime. 30 tablet 3  . Multiple Vitamin (MULTIVITAMIN) tablet Take 1 tablet by mouth daily.    . simvastatin (ZOCOR) 10 MG tablet Take 10 mg by mouth at bedtime.  3   Current Facility-Administered Medications on File Prior to Visit  Medication Dose Route Frequency Provider Last Rate Last Dose  . triamcinolone acetonide (KENALOG) 10 MG/ML injection 10 mg  10 mg Other Once [name], DPM      . triamcinolone acetonide (KENALOG) 10 MG/ML injection 10 mg  10 mg Other Once [name], DPM      . triamcinolone acetonide (KENALOG) 10 MG/ML injection 10 mg  10 mg Other Once [place], Laurena Valko, DPM      . triamcinolone acetonide (KENALOG-40) injection 20 mg  20 mg Other Once [place], Laekyn Rayos, DPM      . triamcinolone acetonide (KENALOG-40) injection 20 mg  20 mg Other Once [place], Madalyn Legner, DPM      . triamcinolone acetonide (KENALOG-40) injection 20 mg  20 mg Other Once [name], DPM        No Known Allergies  Objective:  General: Alert and oriented x3 in no acute distress  Dermatology:Old surgical scars well healed, No open lesions bilateral lower extremities, no webspace macerations, no ecchymosis bilateral, all nails x 10 are well manicured.  There is mild dry skin along the lateral nail margin with  no signs of symptoms of acute ingrowing or recurrence.  Vascular: Dorsalis Pedis and Posterior Tibial pedal pulses palpable, Capillary Fill Time 3 seconds, (+) pedal hair growth bilateral, no edema bilateral lower extremities, Temperature gradient within normal limits.  Neurology: Gross sensation intact via light touch on right. 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 (- )Tinels sign bilateral.   Musculoskeletal: Mild tenderness  at dorsal lateral right, midfoot no reproducible pain at this visit at the PT tendon,No pain with calf compression bilateral. Midtarsal and ankle joint range of motion limited bilateral, Subtalar joint range of  motion is within normal limits except at first MPJs and at dorsal midfoot right greater than left with there is a dorsal bone spur and evidence of arthritis as shown on her MRI, there is no 1st ray hypermobility noted bilateral, mild decreased 1st MPJ rom Right>Left with functional limitus noted on weightbearing exam. L drop foot. Strength within normal limits in all groups bilateral except left.   Assessment and Plan: Problem List Items Addressed This Visit    None    Visit Diagnoses    Right foot pain    -  Primary   Tendinitis       Bony exostosis       Capsulitis of right foot       Osteoarthritis of ankle and foot, unspecified laterality       Relevant Medications   celecoxib (CELEBREX) 200 MG capsule   Neuritis       Foot drop, left         -Complete examination performed -Re-Discussed treatement options for overuse tendonitis with arthritis  -Advised patient that we have to be careful with giving her too many steroid shots, it may be a good option to reserve this possible third shot for a future flare up -Rx Celebrex -Recommend patient to see a rheumatologist for further evaluation of painful swollen joints since she has it in multiple areas not just in her foot to see if there is any other possible explanation for patient's symptoms and any other possible treatment -Recommend continue with compression stocking for periods of long standing and walking for edema control -Recommend continue with good supportive shoes and dynamic AFO on left -Continue with neurology follow-up -Patient to return to office as needed/after seen by rheumatology or sooner if problems or issues arise or condition worsens.  [name], DPM

## 2017-12-20 ENCOUNTER — Ambulatory Visit (INDEPENDENT_AMBULATORY_CARE_PROVIDER_SITE_OTHER): Payer: 59

## 2017-12-20 DIAGNOSIS — R292 Abnormal reflex: Secondary | ICD-10-CM

## 2017-12-22 ENCOUNTER — Telehealth: Payer: Self-pay | Admitting: *Deleted

## 2017-12-22 NOTE — Telephone Encounter (Signed)
Spoke with Meloni and reviewed below MRI report.  She verbalized understanding of same/fim

## 2017-12-22 NOTE — Telephone Encounter (Signed)
-----   Message from Britt Bottom, MD sent at 12/22/2017  3:43 PM EDT ----- Please let him know that the MRIs of the cervical spine did show moderate spinal stenosis at one level and mild spinal stenosis at other levels in the cervical spine.  The spinal cord looked good so this is unlikely to affect her walking.

## 2017-12-26 DIAGNOSIS — M79642 Pain in left hand: Secondary | ICD-10-CM | POA: Diagnosis not present

## 2017-12-26 DIAGNOSIS — M2550 Pain in unspecified joint: Secondary | ICD-10-CM | POA: Diagnosis not present

## 2017-12-26 DIAGNOSIS — M79641 Pain in right hand: Secondary | ICD-10-CM | POA: Diagnosis not present

## 2018-01-08 ENCOUNTER — Other Ambulatory Visit: Payer: Self-pay | Admitting: Neurology

## 2018-01-17 ENCOUNTER — Telehealth: Payer: Self-pay | Admitting: *Deleted

## 2018-01-17 NOTE — Telephone Encounter (Signed)
Pt [employer] form on [employer].

## 2018-01-23 DIAGNOSIS — M2550 Pain in unspecified joint: Secondary | ICD-10-CM | POA: Diagnosis not present

## 2018-01-23 DIAGNOSIS — M79641 Pain in right hand: Secondary | ICD-10-CM | POA: Diagnosis not present

## 2018-01-23 DIAGNOSIS — M79642 Pain in left hand: Secondary | ICD-10-CM | POA: Diagnosis not present

## 2018-03-13 ENCOUNTER — Other Ambulatory Visit: Payer: Self-pay | Admitting: Sports Medicine

## 2018-03-13 DIAGNOSIS — M19079 Primary osteoarthritis, unspecified ankle and foot: Secondary | ICD-10-CM

## 2018-03-14 DIAGNOSIS — Z1231 Encounter for screening mammogram for malignant neoplasm of breast: Secondary | ICD-10-CM | POA: Diagnosis not present

## 2018-03-14 DIAGNOSIS — Z01419 Encounter for gynecological examination (general) (routine) without abnormal findings: Secondary | ICD-10-CM | POA: Diagnosis not present

## 2018-03-16 DIAGNOSIS — H6123 Impacted cerumen, bilateral: Secondary | ICD-10-CM | POA: Diagnosis not present

## 2018-03-16 DIAGNOSIS — J0100 Acute maxillary sinusitis, unspecified: Secondary | ICD-10-CM | POA: Diagnosis not present

## 2018-03-30 DIAGNOSIS — J309 Allergic rhinitis, unspecified: Secondary | ICD-10-CM | POA: Diagnosis not present

## 2018-03-30 DIAGNOSIS — I10 Essential (primary) hypertension: Secondary | ICD-10-CM | POA: Diagnosis not present

## 2018-03-30 DIAGNOSIS — E782 Mixed hyperlipidemia: Secondary | ICD-10-CM | POA: Diagnosis not present

## 2018-03-30 DIAGNOSIS — E559 Vitamin D deficiency, unspecified: Secondary | ICD-10-CM | POA: Diagnosis not present

## 2018-04-03 ENCOUNTER — Telehealth: Payer: Self-pay | Admitting: Neurology

## 2018-04-03 NOTE — Telephone Encounter (Signed)
Noted.  Will look for sooner appt. and call pt. if one becomes available/fim

## 2018-04-03 NOTE — Telephone Encounter (Signed)
Pt is having constant left foot tingling and heaviness for the past 2 weeks. She presently has an appt on 10/15 but she is wanting to be seen sooner if possible. I have added to the wait list.

## 2018-04-04 ENCOUNTER — Other Ambulatory Visit: Payer: Self-pay | Admitting: Neurology

## 2018-04-07 ENCOUNTER — Other Ambulatory Visit: Payer: Self-pay | Admitting: Sports Medicine

## 2018-04-07 DIAGNOSIS — M19079 Primary osteoarthritis, unspecified ankle and foot: Secondary | ICD-10-CM

## 2018-04-25 ENCOUNTER — Ambulatory Visit: Payer: 59 | Admitting: Neurology

## 2018-04-25 ENCOUNTER — Encounter: Payer: Self-pay | Admitting: Neurology

## 2018-04-25 ENCOUNTER — Encounter

## 2018-04-25 ENCOUNTER — Other Ambulatory Visit: Payer: Self-pay

## 2018-04-25 VITALS — BP 116/80 | HR 70 | Resp 16 | Ht 62.0 in | Wt 143.5 lb

## 2018-04-25 DIAGNOSIS — R208 Other disturbances of skin sensation: Secondary | ICD-10-CM | POA: Insufficient documentation

## 2018-04-25 DIAGNOSIS — R269 Unspecified abnormalities of gait and mobility: Secondary | ICD-10-CM | POA: Diagnosis not present

## 2018-04-25 DIAGNOSIS — M21372 Foot drop, left foot: Secondary | ICD-10-CM | POA: Diagnosis not present

## 2018-04-25 DIAGNOSIS — G5702 Lesion of sciatic nerve, left lower limb: Secondary | ICD-10-CM | POA: Diagnosis not present

## 2018-04-25 DIAGNOSIS — R292 Abnormal reflex: Secondary | ICD-10-CM

## 2018-04-25 MED ORDER — IMIPRAMINE HCL 25 MG PO TABS: 50.0000 mg | ORAL_TABLET | Freq: Every day | ORAL | 3 refills | Status: AC

## 2018-04-25 NOTE — Progress Notes (Signed)
[facility]  PATIENT: Bailey Gomez DOB: 12-03-1960  REFERRING DOCTOR OR PCP:  Dr. Cannon Kettle, DPM, Delorise Shiner (PCP) SOURCE: patient, notes from Dr. Cannon Kettle, imaging reports, MRI images on PACS.  _________________________________   HISTORICAL  CHIEF COMPLAINT:  Chief Complaint  Patient presents with  . Peripheral Neuropathy    Sts. she is having more tingling, pain in left foot over the last month./fim    HISTORY OF PRESENT ILLNESS:  Bailey Gomez is a 57 y.o. woman with pain and numbness in the left foot.  Update 04/25/2018: She is noting continued numbness in her left foot.  She had weakness and numbness that followed her left hip replacement in 2017.   Initially weakness was worse but is still significant and she wears a brace for her left foot drop.  She can barely wiggle her toes and the ankle is very weak.   The foot is painful and throbbing at times.    Imipramine seemed to help at first but less so more recently.    She tolerates it well and does not note sleepiness or dry mouth or urinary retention.    In the past, she had been on gabapentin and believes that the pill was a small white capsule.  Therefore, she was probably only on a very low dose and may never have been given an adequate trial.  MRI of the cervical spine 12/21/2017 showed a normal spinal cord.  There is mild spinal stenosis at C4-C5 and C5-C6 and moderate spinal stenosis at C6-C7.  There was no nerve root compression.  MRI of the thoracic spine 12/21/2017 showed a normal spinal cord and mild degenerative changes but no nerve root compression or spinal stenosis.   From 12/14/2017: She reports pain and numbness began in the entire left foot after a total left hip replacement in 2017.   The same day as her surgery, she first reports having numbness in the left great toe.   Over a week, she felt the numbness involved more of the foot to involve the entire bottom of her foot.   She feels it is like  she had a shot of Novocaine.   The distribution is the entire sole and the heel.    She noted the weakness immediately after her surgery as well.   She noted more weakness at the toes and ankles while doing PT when she started to move around more.   The hip flexion and leg extension are not weak.    She also notes cramps in her calf.    The cramps have become very painful.  More recently, she saw Dr. Narda Amber who ordered an AFO brace.   She got a brace online which is helping her some.      Currently, besides the numbness she has pain that worsens when she is on her foot more and has continued weakness that only slightly improved from its nadir shortly after surgery.    The throbbing pain has not been helped by Aleve, gabapentin and flexeril.    She gets cramps a few times a day not helped by Flexeril.    Pain is worse at night due to more cramps and she has trouble staying asleep.      Although she felt she had some improvement in strength the first year she heas not noted further improvement the past year.   The spasms and dysesthesia have changed some in character but the pain is about the same.  The spasticity at night seems to be worse over the past 6 to 12 months.  She denies LBP.    Bladder function is fine.     She first saw her Teodora Medici, [place] at [facility] in February 2018.  She had an MRI of the lumbar spine that showed degenerative changes at L4-L5.  Of note, she has a transitional S1 segment.   Repeat MRI 09/28/2017 showed some progression of the L4-L5 degenerative changes with adjacent edema in the endplates.  She also has facet hypertrophy.  I personally reviewed both MRIs.  There did not appear to be nerve root compression.    She also had a nerve conduction/EMG which showed an incomplete left sciatic neuropathy near the hip as there was re-innervation in the gluteus medius muscle.   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       REVIEW OF SYSTEMS: Constitutional: No fevers, chills, sweats, or change in appetite Eyes: No visual changes, double vision, eye pain Ear, nose and throat: No hearing loss, ear pain, nasal congestion, sore throat Cardiovascular: No chest pain, palpitations Respiratory: No shortness of breath at rest or with exertion.   No wheezes GastrointestinaI: No nausea, vomiting, diarrhea, abdominal pain, fecal incontinence Genitourinary: No dysuria, urinary retention or frequency.  No nocturia. Musculoskeletal: No neck pain, back pain Integumentary: No rash, pruritus, skin lesions Neurological: as above Psychiatric: No depression at this time.  No anxiety Endocrine: No palpitations, diaphoresis, change in appetite, change in weigh or increased thirst Hematologic/Lymphatic: No anemia, purpura, petechiae. Allergic/Immunologic: No itchy/runny eyes, nasal congestion, recent allergic reactions, rashes  ALLERGIES: No Known Allergies  HOME MEDICATIONS:  Current Outpatient Medications:  .  AMBULATORY NON FORMULARY MEDICATION, 1 Units by Other route daily. Left AFO, Disp: 1 Units, Rfl: 0 .  amLODipine (NORVASC) 10 MG tablet, Take 10 mg by mouth daily., Disp: , Rfl: 3 .  aspirin 81 MG EC tablet, , Disp: , Rfl:  .  carisoprodol (SOMA) 350 MG tablet, Take 1 tablet (350 mg total) by mouth at bedtime., Disp: 30 tablet, Rfl: 5 .  celecoxib (CELEBREX) 200 MG capsule, TAKE 1 CAPSULE BY MOUTH TWICE A DAY, Disp: 90 capsule, Rfl: 0 .  diclofenac (VOLTAREN) 75 MG EC tablet, Take 1 tablet (75 mg total) by mouth 2 (two) times daily., Disp: 30 tablet, Rfl: 0 .  imipramine (TOFRANIL) 25 MG tablet, Take 2 tablets (50 mg total) by mouth at bedtime., Disp: 180 tablet, Rfl: 3 .  Multiple Vitamin (MULTIVITAMIN) tablet, Take 1 tablet by mouth daily., Disp: , Rfl:  .  simvastatin (ZOCOR) 10 MG tablet, Take 10 mg by mouth at bedtime., Disp: , Rfl: 3 .  cyclobenzaprine (FLEXERIL) 5 MG tablet,  Take 1 tablet (5 mg total) by mouth 3 (three) times daily as needed for muscle spasms. (Patient not taking: Reported on 04/25/2018), Disp: 30 tablet, Rfl: 5  Current Facility-Administered Medications:  .  triamcinolone acetonide (KENALOG) 10 MG/ML injection 10 mg, 10 mg, Other, Once, [place], [place], DPM .  triamcinolone acetonide (KENALOG) 10 MG/ML injection 10 mg, 10 mg, Other, Once, [place], [place], DPM .  triamcinolone acetonide (KENALOG) 10 MG/ML injection 10 mg, 10 mg, Other, Once, [place], [place], DPM .  triamcinolone acetonide (KENALOG-40) injection 20 mg, 20 mg, Other, Once, [place], [place], DPM .  triamcinolone acetonide (KENALOG-40) injection 20 mg, 20 mg, Other, Once, [place], [place], DPM .  triamcinolone acetonide (KENALOG-40) injection 20 mg, 20  mg, Other, Once, [name], DPM  PAST MEDICAL HISTORY: Past Medical History:  Diagnosis Date  . Complex endometrial hyperplasia with atypia   . 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 Family history of breast cancer 03/23/2017  . Family history of Lynch syndrome   . Genetic testing 04/14/2017   The patient had genetic testing due to a personal history of uterine cancer and a family history of Lynch Syndrome (MSH6).  The Common Hereditary Cancer Panel was ordered.  The Hereditary Gene Panel offered by [facility] includes sequencing and/or deletion duplication testing of the following 46 genes: APC, ATM, AXIN2, BARD1, BMPR1A, BRCA1, BRCA2, BRIP1, CDH1, CDKN2A (p14ARF), CDKN2A (p16INK4a), CHEK  . MSH6-related Lynch syndrome (HNPCC5)     PAST SURGICAL HISTORY: Past Surgical History:  Procedure Laterality Date  . ABDOMINAL HYSTERECTOMY  12/23/2010   TAH  . BUNIONECTOMY Bilateral   . HYSTEROSCOPY W/D&C  10/2010  . TOTAL HIP ARTHROPLASTY Left 04/29/2016    FAMILY HISTORY: Family History  Problem Relation Age of Onset  . Aneurysm Mother        died at 59  . Heart attack Father        died at 66   . Breast cancer Sister 63       brain mets    SOCIAL HISTORY:  Social History   Socioeconomic History  . Marital status: Married    Spouse name: Not on file  . Number of children: 2  . Years of education: 35  . Highest education level: Not on file  Occupational History  . Occupation: retired  Scientific laboratory technician  . Financial resource strain: Not on file  . Food insecurity:    Worry: Not on file    Inability: Not on file  . Transportation needs:    Medical: Not on file    Non-medical: Not on file  Tobacco Use  . Smoking status: Never Smoker  . Smokeless tobacco: Never Used  Substance and Sexual Activity  . Alcohol use: No    Comment: occassional  . Drug use: No  . Sexual activity: Yes  Lifestyle  . Physical activity:    Days per week: Not on file    Minutes per session: Not on file  . Stress: Not on file  Relationships  . Social connections:    Talks on phone: Not on file    Gets together: Not on file    Attends religious service: Not on file    Active member of club or organization: Not on file    Attends meetings of clubs or organizations: Not on file    Relationship status: Not on file  . Intimate partner violence:    Fear of current or ex partner: Not on file    Emotionally abused: Not on file    Physically abused: Not on file    Forced sexual activity: Not on file  Other Topics Concern  . Not on file  Social History Narrative   Lives with husband in a one story home.  Has 2 sons.  Retired from [employer].  Education: high school.  +     PHYSICAL EXAM  Vitals:   04/25/18 1108  BP: 116/80  Pulse: 70  Resp: 16  Weight: 143 lb 8 oz (65.1 kg)  Height: _0  (1.575 m)    Body mass index is 26.25 kg/m.   General: The patient is well-developed and well-nourished and in no acute  distress  Skin: Extremities are without rash or edema.  Musculoskeletal:  Back is nontender  Neurologic Exam  Mental status: The patient is alert and  oriented x 3 at the time of the examination. The patient has apparent normal recent and remote memory, with an apparently normal attention span and concentration ability.   Speech is normal.  Cranial nerves: Extraocular movements are full.  Facial strength is normal.  Trapezius strength is normal.  The tongue is midline, and the patient has symmetric elevation of the soft palate. No obvious hearing deficits are noted.  Motor: The circumference of the lower leg on the left is 2 cm less than the circumference on the right measured 10 cm below the knee.   Muscle tone was normal.  Strength was normal in the arms and the right leg.  Hip and knee flexors and extensors have normal strength in the left.  Strength was 2- /5 in the lower leg with ankle and toe extension and flexion.  Sensory: She reports reduced sensation in left foot to touch relative to the right foot.   She notes more numbness in the lateral foot than the top of the foot or medial aspect.   She also notes reduced vibration in the left ankle and plantar foot compared to the right.  Coordination: Finger-nose-finger is performed well bilaterally.  Heel-to-shin is normal in the right leg.  This is reduced on the left  Gait and station: Station is normal.   The gait has a slightly reduced stride with a foot drop on the left.  She does not need to use a cane.. Tandem gait is reduced . Romberg is negative.   Reflexes: Deep tendon reflexes are symmetric and normal in the arms,  but she increased reflexes at the knees with spread.  She has no ankle reflex on the left..   Plantar responses are flexor.    DIAGNOSTIC DATA (LABS, IMAGING, TESTING) - I reviewed patient records, labs, notes, testing and imaging myself where available.  Lab Results  Component Value Date   WBC 12.7 (H) 12/23/2010   HGB 12.2 12/23/2010   HCT 37.6 12/23/2010   MCV 91.3 12/23/2010   PLT 237 12/23/2010      Component Value Date/Time   NA 133 (L) 12/23/2010 0447    K 4.2 12/23/2010 0447   CL 100 12/23/2010 0447   CO2 25 12/23/2010 0447   GLUCOSE 185 (H) 12/23/2010 0447   BUN 9 12/23/2010 0447   CREATININE 0.47 12/23/2010 0447   CALCIUM 8.9 12/23/2010 0447   GFRNONAA >60 12/23/2010 0447   GFRAA >60 12/23/2010 0447   No results found for: CHOL, HDL, LDLCALC, LDLDIRECT, TRIG, CHOLHDL No results found for: HGBA1C Lab Results  Component Value Date   IPJASNKN39 767 12/17/2016   Lab Results  Component Value Date   TSH 1.59 12/17/2016       ASSESSMENT AND PLAN  Neuropathy of left sciatic nerve  Left foot drop  Gait disturbance  Abnormal reflex  Dysesthesia  1.   Increase imipramine to 50 mg nightly. 2.   If not better after a month, then add gabapentin (was only on low dose in the past. 3.   rtc 5-6 months but call if not doing better or new or worsening symptoms  Richard A. Felecia Shelling, MD, [facility] 34/19/3790, 2:40 PM Certified in Neurology, Clinical Neurophysiology, Sleep Medicine, Pain Medicine and Neuroimaging  [facility] [address], [place], [place] [address] [phone]

## 2018-05-03 ENCOUNTER — Telehealth: Payer: Self-pay | Admitting: *Deleted

## 2018-05-03 NOTE — Telephone Encounter (Signed)
Pt [employer] form on Emma desk. 

## 2018-05-05 ENCOUNTER — Telehealth: Payer: Self-pay | Admitting: *Deleted

## 2018-05-05 NOTE — Telephone Encounter (Signed)
Called and spoke with husband. 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 No neurologic reason showing need for disability. She had disability paperwork filled out before after hip surgery. Advised she will have to f/u with who originally put her on disability. He verbalized understanding. They will need refund. I gave back to medical records to process this.

## 2018-06-26 DIAGNOSIS — R682 Dry mouth, unspecified: Secondary | ICD-10-CM | POA: Diagnosis not present

## 2018-06-27 DIAGNOSIS — M2550 Pain in unspecified joint: Secondary | ICD-10-CM | POA: Diagnosis not present

## 2018-06-27 DIAGNOSIS — H6123 Impacted cerumen, bilateral: Secondary | ICD-10-CM | POA: Diagnosis not present

## 2018-06-27 DIAGNOSIS — H61303 Acquired stenosis of external ear canal, unspecified, bilateral: Secondary | ICD-10-CM | POA: Diagnosis not present

## 2018-06-30 ENCOUNTER — Other Ambulatory Visit: Payer: Self-pay | Admitting: Neurology

## 2018-07-13 DIAGNOSIS — E559 Vitamin D deficiency, unspecified: Secondary | ICD-10-CM | POA: Diagnosis not present

## 2018-07-13 DIAGNOSIS — E782 Mixed hyperlipidemia: Secondary | ICD-10-CM | POA: Diagnosis not present

## 2018-07-13 DIAGNOSIS — I10 Essential (primary) hypertension: Secondary | ICD-10-CM | POA: Diagnosis not present

## 2018-07-20 DIAGNOSIS — M1990 Unspecified osteoarthritis, unspecified site: Secondary | ICD-10-CM | POA: Diagnosis not present

## 2018-07-20 DIAGNOSIS — I10 Essential (primary) hypertension: Secondary | ICD-10-CM | POA: Diagnosis not present

## 2018-07-20 DIAGNOSIS — E782 Mixed hyperlipidemia: Secondary | ICD-10-CM | POA: Diagnosis not present

## 2018-09-26 ENCOUNTER — Ambulatory Visit: Payer: 59 | Admitting: Neurology

## 2018-11-15 DIAGNOSIS — E559 Vitamin D deficiency, unspecified: Secondary | ICD-10-CM | POA: Diagnosis not present

## 2018-11-15 DIAGNOSIS — Z79899 Other long term (current) drug therapy: Secondary | ICD-10-CM | POA: Diagnosis not present

## 2018-11-15 DIAGNOSIS — E782 Mixed hyperlipidemia: Secondary | ICD-10-CM | POA: Diagnosis not present

## 2018-11-22 DIAGNOSIS — E782 Mixed hyperlipidemia: Secondary | ICD-10-CM | POA: Diagnosis not present

## 2018-11-22 DIAGNOSIS — M1990 Unspecified osteoarthritis, unspecified site: Secondary | ICD-10-CM | POA: Diagnosis not present

## 2018-11-22 DIAGNOSIS — I10 Essential (primary) hypertension: Secondary | ICD-10-CM | POA: Diagnosis not present
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
## 2019-11-09 ENCOUNTER — Ambulatory Visit (INDEPENDENT_AMBULATORY_CARE_PROVIDER_SITE_OTHER): Payer: 59

## 2019-11-09 ENCOUNTER — Encounter: Payer: Self-pay | Admitting: Sports Medicine

## 2019-11-09 ENCOUNTER — Other Ambulatory Visit: Payer: Self-pay

## 2019-11-09 ENCOUNTER — Other Ambulatory Visit: Payer: Self-pay | Admitting: Sports Medicine

## 2019-11-09 ENCOUNTER — Ambulatory Visit: Payer: 59 | Admitting: Sports Medicine

## 2019-11-09 DIAGNOSIS — M19079 Primary osteoarthritis, unspecified ankle and foot: Secondary | ICD-10-CM

## 2019-11-09 DIAGNOSIS — M79671 Pain in right foot: Secondary | ICD-10-CM | POA: Diagnosis not present

## 2019-11-09 DIAGNOSIS — M779 Enthesopathy, unspecified: Secondary | ICD-10-CM | POA: Diagnosis not present

## 2019-11-09 DIAGNOSIS — M778 Other enthesopathies, not elsewhere classified: Secondary | ICD-10-CM

## 2019-11-09 DIAGNOSIS — M792 Neuralgia and neuritis, unspecified: Secondary | ICD-10-CM

## 2019-11-09 DIAGNOSIS — L603 Nail dystrophy: Secondary | ICD-10-CM

## 2019-11-09 MED ORDER — TRIAMCINOLONE ACETONIDE 10 MG/ML IJ SUSP
10.0000 mg | Freq: Once | INTRAMUSCULAR | Status: AC
  Administered 2019-11-09: 10 mg

## 2019-11-09 NOTE — Progress Notes (Signed)
Subjective: Bailey Gomez is a 59 y.o. female patient who presents to office for evaluation of Right  foot pain. Patient complains of progressive pain especially over the last 2 months in the Right foot at the top side of right foot, burning in nature after stepping on a root in the yard, was pain at bottom now that is better but pain remains on top of the foot. Ranks pain 10/10 and is now interferring with daily activities. Patient has tried everything possible with no relief in symptoms. Admits for the last 3 weeks has also noticed that her left hallux nail is discolored, denies injury and has not tried any treatment. Patient denies any other pedal complaints.  Review of Systems  All other systems reviewed and are negative.    Patient Active Problem List   Diagnosis Date Noted  . Dysesthesia 04/25/2018  . Left foot drop 12/14/2017  . Gait disturbance 12/14/2017  . Abnormal reflex 12/14/2017  . Genetic testing 04/14/2017  . MSH6-related Lynch syndrome (HNPCC5)   . Family history of Lynch syndrome 03/23/2017  . Family history of breast cancer 03/23/2017  . 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 Menopausal symptoms 04/12/2013  . Endometrial cancer ([place]) 02/17/2012    Current Outpatient Medications on File Prior to Visit  Medication Sig Dispense Refill  . AMBULATORY NON FORMULARY MEDICATION 1 Units by Other route daily. Left AFO 1 Units 0  . amLODipine (NORVASC) 10 MG tablet Take 10 mg by mouth daily.  3  . aspirin 81 MG EC tablet     . carisoprodol (SOMA) 350 MG tablet Take 1 tablet (350 mg total) by mouth at bedtime. 30 tablet 5  . celecoxib (CELEBREX) 200 MG capsule TAKE 1 CAPSULE BY MOUTH TWICE A DAY 90 capsule 0  . cyclobenzaprine (FLEXERIL) 5 MG tablet Take 1 tablet (5 mg total) by mouth 3 (three) times daily as needed for muscle spasms. (Patient not taking: Reported on 04/25/2018) 30 tablet 5  . diclofenac (VOLTAREN) 75 MG EC tablet Take 1 tablet (75 mg total) by mouth 2  (two) times daily. 30 tablet 0  . imipramine (TOFRANIL) 25 MG tablet Take 2 tablets (50 mg total) by mouth at bedtime. 180 tablet 3  . Multiple Vitamin (MULTIVITAMIN) tablet Take 1 tablet by mouth daily.    . simvastatin (ZOCOR) 10 MG tablet Take 10 mg by mouth at bedtime.  3   Current Facility-Administered Medications on File Prior to Visit  Medication Dose Route Frequency Provider Last Rate Last Admin  . triamcinolone acetonide (KENALOG) 10 MG/ML injection 10 mg  10 mg Other Once [name], DPM      . triamcinolone acetonide (KENALOG) 10 MG/ML injection 10 mg  10 mg Other Once [name], DPM      . triamcinolone acetonide (KENALOG) 10 MG/ML injection 10 mg  10 mg Other Once [place], Vernel Donlan, DPM      . triamcinolone acetonide (KENALOG-40) injection 20 mg  20 mg Other Once [place], Ondine Gemme, DPM      . triamcinolone acetonide (KENALOG-40) injection 20 mg  20 mg Other Once Aija Scarfo, DPM      . triamcinolone acetonide (KENALOG-40) injection 20 mg  20 mg Other Once [name], DPM        No Known Allergies  Objective:  General: Alert and oriented x3 in no acute distress  Dermatology: No open lesions bilateral lower extremities, no webspace macerations, no ecchymosis bilateral, all nails x 10 are well manicured but there  is dry blood at distal edge of right 1st toenail and discoloration noted to left hallux nail distal aspect.  Vascular: Dorsalis Pedis and Posterior Tibial pedal pulses palpable, Capillary Fill Time 3 seconds,(+) pedal hair growth bilateral, no edema bilateral lower extremities, Temperature gradient within normal limits.  Neurology: Gross sensation intact via light touch bilateral, +++burning pain to dorsolateral right foot.  Musculoskeletal: Mild tenderness with palpation at dorsal lateral midfoot on right with palpable bone spur at midfoot, Strength within normal limits in all groups bilateral.   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   Assessment and Plan: Problem List Items Addressed This Visit    None    Visit Diagnoses    Capsulitis of right foot    -  Primary   Tendinitis       Neuritis       Right foot pain       Arthritis of foot       Nail dystrophy           -Complete examination performed -Xrays reviewed -Discussed treatement options for right foot pain and nail discoloration  -After oral consent and aseptic prep, injected a mixture containing 1 ml of 2%  plain lidocaine, 1 ml 0.5% plain marcaine, 0.5 ml of kenalog 10 and 0.5 ml of dexamethasone phosphate into right dorsolateral midfoot without complication. Post-injection care discussed with patient.  -Rx Ankle Guantlet to use as directed -Recommend tea tree pil to nails and to let these nail changes grow out -Recommend good supportive shoes daily -Patient to return to office if no better after 1 month or sooner if condition worsens.  [name], DPM

## 2019-11-16 ENCOUNTER — Other Ambulatory Visit: Payer: Self-pay | Admitting: Sports Medicine

## 2019-11-16 DIAGNOSIS — M778 Other enthesopathies, not elsewhere classified: Secondary | ICD-10-CM

## 2022-12-21 ENCOUNTER — Other Ambulatory Visit: Payer: Self-pay | Admitting: Student

## 2022-12-21 DIAGNOSIS — Z1231 Encounter for screening mammogram for malignant neoplasm of breast: Secondary | ICD-10-CM

## 2022-12-24 ENCOUNTER — Ambulatory Visit
Admission: RE | Admit: 2022-12-24 | Discharge: 2022-12-24 | Disposition: A | Discharge status: Home / Self Care | Payer: 59 | Source: Ambulatory Visit | Attending: Family | Admitting: Family

## 2022-12-24 DIAGNOSIS — Z1231 Encounter for screening mammogram for malignant neoplasm of breast: Secondary | ICD-10-CM

## 2024-01-16 ENCOUNTER — Other Ambulatory Visit: Payer: Self-pay | Admitting: Family

## 2024-01-16 ENCOUNTER — Other Ambulatory Visit: Payer: Self-pay | Admitting: Student

## 2024-01-16 DIAGNOSIS — Z1231 Encounter for screening mammogram for malignant neoplasm of breast: Secondary | ICD-10-CM

## 2024-01-27 ENCOUNTER — Ambulatory Visit
Admission: RE | Admit: 2024-01-27 | Discharge: 2024-01-27 | Disposition: A | Discharge status: Home / Self Care | Source: Ambulatory Visit | Attending: Student | Admitting: Student

## 2024-01-27 ENCOUNTER — Other Ambulatory Visit: Payer: Self-pay | Admitting: Family

## 2024-01-27 DIAGNOSIS — Z1231 Encounter for screening mammogram for malignant neoplasm of breast: Secondary | ICD-10-CM
